# Patient Record
Sex: Female | Born: 1955 | Race: Black or African American | Hispanic: No | Marital: Married | Smoking: Never smoker
Health system: Southern US, Community
[De-identification: ages and names within clinical notes are randomized; demographics above are authoritative.]

## PROBLEM LIST (undated history)

## (undated) DIAGNOSIS — Z5189 Encounter for other specified aftercare: Secondary | ICD-10-CM

## (undated) DIAGNOSIS — F329 Major depressive disorder, single episode, unspecified: Secondary | ICD-10-CM

## (undated) DIAGNOSIS — T7840XA Allergy, unspecified, initial encounter: Secondary | ICD-10-CM

## (undated) DIAGNOSIS — E119 Type 2 diabetes mellitus without complications: Secondary | ICD-10-CM

## (undated) DIAGNOSIS — K635 Polyp of colon: Secondary | ICD-10-CM

## (undated) DIAGNOSIS — G4730 Sleep apnea, unspecified: Secondary | ICD-10-CM

## (undated) DIAGNOSIS — R011 Cardiac murmur, unspecified: Secondary | ICD-10-CM

## (undated) DIAGNOSIS — Z992 Dependence on renal dialysis: Secondary | ICD-10-CM

## (undated) DIAGNOSIS — N189 Chronic kidney disease, unspecified: Secondary | ICD-10-CM

## (undated) DIAGNOSIS — D649 Anemia, unspecified: Secondary | ICD-10-CM

## (undated) DIAGNOSIS — I10 Essential (primary) hypertension: Secondary | ICD-10-CM

## (undated) DIAGNOSIS — F32A Depression, unspecified: Secondary | ICD-10-CM

## (undated) DIAGNOSIS — F419 Anxiety disorder, unspecified: Secondary | ICD-10-CM

## (undated) DIAGNOSIS — E785 Hyperlipidemia, unspecified: Secondary | ICD-10-CM

## (undated) HISTORY — PX: TUBAL LIGATION: SHX77

## (undated) HISTORY — DX: Encounter for other specified aftercare: Z51.89

## (undated) HISTORY — DX: Anxiety disorder, unspecified: F41.9

## (undated) HISTORY — PX: COLONOSCOPY: SHX174

## (undated) HISTORY — DX: Cardiac murmur, unspecified: R01.1

## (undated) HISTORY — DX: Depression, unspecified: F32.A

## (undated) HISTORY — DX: Anemia, unspecified: D64.9

## (undated) HISTORY — DX: Essential (primary) hypertension: I10

## (undated) HISTORY — DX: Allergy, unspecified, initial encounter: T78.40XA

## (undated) HISTORY — DX: Hyperlipidemia, unspecified: E78.5

## (undated) HISTORY — DX: Type 2 diabetes mellitus without complications: E11.9

## (undated) HISTORY — DX: Polyp of colon: K63.5

## (undated) HISTORY — PX: DIALYSIS FISTULA CREATION: SHX611

## (undated) HISTORY — DX: Major depressive disorder, single episode, unspecified: F32.9

## (undated) HISTORY — DX: Sleep apnea, unspecified: G47.30

## (undated) HISTORY — DX: Chronic kidney disease, unspecified: N18.9

## (undated) HISTORY — DX: Dependence on renal dialysis: Z99.2

---

## 2006-06-11 ENCOUNTER — Ambulatory Visit: Payer: Self-pay | Admitting: Family Medicine

## 2006-06-12 ENCOUNTER — Ambulatory Visit (HOSPITAL_COMMUNITY): Admission: RE | Admit: 2006-06-12 | Discharge: 2006-06-12 | Payer: Self-pay | Admitting: Family Medicine

## 2006-06-18 ENCOUNTER — Ambulatory Visit (HOSPITAL_COMMUNITY): Payer: Self-pay | Admitting: Psychiatry

## 2006-06-19 ENCOUNTER — Ambulatory Visit (HOSPITAL_COMMUNITY): Admission: RE | Admit: 2006-06-19 | Discharge: 2006-06-19 | Payer: Self-pay | Admitting: Family Medicine

## 2006-06-26 ENCOUNTER — Ambulatory Visit (HOSPITAL_COMMUNITY): Payer: Self-pay | Admitting: Psychiatry

## 2006-07-03 ENCOUNTER — Ambulatory Visit (HOSPITAL_COMMUNITY): Payer: Self-pay | Admitting: Psychiatry

## 2006-07-30 ENCOUNTER — Ambulatory Visit: Payer: Self-pay | Admitting: Family Medicine

## 2006-10-02 ENCOUNTER — Ambulatory Visit: Payer: Self-pay | Admitting: Family Medicine

## 2006-12-26 ENCOUNTER — Encounter: Admission: RE | Admit: 2006-12-26 | Discharge: 2006-12-26 | Payer: Self-pay | Admitting: Family Medicine

## 2006-12-27 ENCOUNTER — Encounter: Payer: Self-pay | Admitting: Family Medicine

## 2006-12-27 ENCOUNTER — Ambulatory Visit (HOSPITAL_COMMUNITY): Payer: Self-pay | Admitting: Psychiatry

## 2006-12-27 LAB — CONVERTED CEMR LAB
ALT: 23 units/L (ref 0–35)
AST: 21 units/L (ref 0–37)
Albumin: 3.7 g/dL (ref 3.5–5.2)
Alkaline Phosphatase: 66 units/L (ref 39–117)
BUN: 17 mg/dL (ref 6–23)
Bilirubin, Direct: 0.1 mg/dL (ref 0.0–0.3)
CO2: 22 meq/L (ref 19–32)
Calcium: 9.1 mg/dL (ref 8.4–10.5)
Chloride: 107 meq/L (ref 96–112)
Cholesterol: 213 mg/dL — ABNORMAL HIGH (ref 0–200)
Creatinine, Ser: 1.5 mg/dL — ABNORMAL HIGH (ref 0.40–1.20)
Glucose, Bld: 86 mg/dL (ref 70–99)
HDL: 47 mg/dL (ref >39)
Indirect Bilirubin: 0.4 mg/dL (ref 0.0–0.9)
LDL Cholesterol: 121 mg/dL — ABNORMAL HIGH (ref 0–99)
Potassium: 5.1 meq/L (ref 3.5–5.3)
Sodium: 139 meq/L (ref 135–145)
Total Bilirubin: 0.5 mg/dL (ref 0.3–1.2)
Total CHOL/HDL Ratio: 4.5
Total Protein: 6.6 g/dL (ref 6.0–8.3)
Triglycerides: 227 mg/dL — ABNORMAL HIGH (ref <150)
VLDL: 45 mg/dL — ABNORMAL HIGH (ref 0–40)

## 2006-12-31 ENCOUNTER — Other Ambulatory Visit: Admission: RE | Admit: 2006-12-31 | Discharge: 2006-12-31 | Payer: Self-pay | Admitting: Family Medicine

## 2006-12-31 ENCOUNTER — Ambulatory Visit: Payer: Self-pay | Admitting: Family Medicine

## 2006-12-31 ENCOUNTER — Encounter: Payer: Self-pay | Admitting: Family Medicine

## 2006-12-31 LAB — CONVERTED CEMR LAB: Pap Smear: NORMAL

## 2007-01-01 ENCOUNTER — Encounter: Payer: Self-pay | Admitting: Family Medicine

## 2007-01-01 LAB — CONVERTED CEMR LAB
Candida species: NEGATIVE
Chlamydia, DNA Probe: NEGATIVE
GC Probe Amp, Genital: NEGATIVE
Gardnerella vaginalis: NEGATIVE
Trichomonal Vaginitis: NEGATIVE

## 2007-02-20 ENCOUNTER — Ambulatory Visit (HOSPITAL_COMMUNITY): Admission: RE | Admit: 2007-02-20 | Discharge: 2007-02-20 | Payer: Self-pay | Admitting: Family Medicine

## 2007-03-12 ENCOUNTER — Ambulatory Visit (HOSPITAL_COMMUNITY): Payer: Self-pay | Admitting: Psychiatry

## 2007-03-19 ENCOUNTER — Ambulatory Visit (HOSPITAL_COMMUNITY): Payer: Self-pay | Admitting: Psychiatry

## 2007-04-02 ENCOUNTER — Ambulatory Visit (HOSPITAL_COMMUNITY): Payer: Self-pay | Admitting: Psychiatry

## 2007-05-05 ENCOUNTER — Ambulatory Visit (HOSPITAL_COMMUNITY): Payer: Self-pay | Admitting: Psychiatry

## 2007-07-17 ENCOUNTER — Ambulatory Visit: Payer: Self-pay | Admitting: Family Medicine

## 2007-07-30 ENCOUNTER — Ambulatory Visit: Payer: Self-pay | Admitting: Family Medicine

## 2007-08-07 ENCOUNTER — Ambulatory Visit: Payer: Self-pay | Admitting: Family Medicine

## 2007-09-04 ENCOUNTER — Ambulatory Visit: Payer: Self-pay | Admitting: Family Medicine

## 2007-11-27 ENCOUNTER — Encounter: Payer: Self-pay | Admitting: Family Medicine

## 2008-02-02 ENCOUNTER — Ambulatory Visit: Payer: Self-pay | Admitting: Family Medicine

## 2008-04-28 ENCOUNTER — Encounter: Payer: Self-pay | Admitting: Family Medicine

## 2008-04-28 DIAGNOSIS — F418 Other specified anxiety disorders: Secondary | ICD-10-CM | POA: Insufficient documentation

## 2008-04-28 DIAGNOSIS — O0289 Other abnormal products of conception: Secondary | ICD-10-CM | POA: Insufficient documentation

## 2008-04-28 DIAGNOSIS — E785 Hyperlipidemia, unspecified: Secondary | ICD-10-CM | POA: Insufficient documentation

## 2008-04-28 DIAGNOSIS — I10 Essential (primary) hypertension: Secondary | ICD-10-CM | POA: Insufficient documentation

## 2008-04-28 DIAGNOSIS — N289 Disorder of kidney and ureter, unspecified: Secondary | ICD-10-CM | POA: Insufficient documentation

## 2008-07-19 ENCOUNTER — Telehealth: Payer: Self-pay | Admitting: Family Medicine

## 2008-08-23 ENCOUNTER — Ambulatory Visit: Payer: Self-pay | Admitting: Family Medicine

## 2008-08-31 ENCOUNTER — Ambulatory Visit: Payer: Self-pay | Admitting: Family Medicine

## 2008-09-20 ENCOUNTER — Telehealth: Payer: Self-pay | Admitting: Family Medicine

## 2008-09-22 ENCOUNTER — Telehealth: Payer: Self-pay | Admitting: Family Medicine

## 2008-09-23 ENCOUNTER — Ambulatory Visit: Payer: Self-pay | Admitting: Family Medicine

## 2008-09-30 ENCOUNTER — Encounter: Payer: Self-pay | Admitting: Family Medicine

## 2008-10-01 LAB — CONVERTED CEMR LAB
ALT: 18 units/L (ref 0–35)
AST: 22 units/L (ref 0–37)
Albumin: 3.7 g/dL (ref 3.5–5.2)
Alkaline Phosphatase: 47 units/L (ref 39–117)
BUN: 21 mg/dL (ref 6–23)
Basophils Absolute: 0 10*3/uL (ref 0.0–0.1)
Basophils Relative: 0 % (ref 0–1)
Bilirubin, Direct: 0.1 mg/dL (ref 0.0–0.3)
CO2: 20 meq/L (ref 19–32)
Calcium: 9.4 mg/dL (ref 8.4–10.5)
Chloride: 109 meq/L (ref 96–112)
Cholesterol: 265 mg/dL — ABNORMAL HIGH (ref 0–200)
Creatinine, Ser: 2.47 mg/dL — ABNORMAL HIGH (ref 0.40–1.20)
Eosinophils Absolute: 0.2 10*3/uL (ref 0.0–0.7)
Eosinophils Relative: 2 % (ref 0–5)
Glucose, Bld: 92 mg/dL (ref 70–99)
HCT: 39.8 % (ref 36.0–46.0)
HDL: 52 mg/dL (ref >39)
Hemoglobin: 12 g/dL (ref 12.0–15.0)
Indirect Bilirubin: 0.3 mg/dL (ref 0.0–0.9)
LDL Cholesterol: 150 mg/dL — ABNORMAL HIGH (ref 0–99)
Lymphocytes Relative: 34 % (ref 12–46)
Lymphs Abs: 2.4 10*3/uL (ref 0.7–4.0)
MCHC: 30.2 g/dL (ref 30.0–36.0)
MCV: 85.8 fL (ref 78.0–100.0)
Monocytes Absolute: 0.4 10*3/uL (ref 0.1–1.0)
Monocytes Relative: 6 % (ref 3–12)
Neutro Abs: 4 10*3/uL (ref 1.7–7.7)
Neutrophils Relative %: 57 % (ref 43–77)
Platelets: 231 10*3/uL (ref 150–400)
Potassium: 5 meq/L (ref 3.5–5.3)
RBC: 4.64 M/uL (ref 3.87–5.11)
RDW: 15.1 % (ref 11.5–15.5)
Sodium: 140 meq/L (ref 135–145)
TSH: 0.946 microintl units/mL (ref 0.350–4.50)
Total Bilirubin: 0.4 mg/dL (ref 0.3–1.2)
Total CHOL/HDL Ratio: 5.1
Total Protein: 7.2 g/dL (ref 6.0–8.3)
Triglycerides: 313 mg/dL — ABNORMAL HIGH (ref <150)
VLDL: 63 mg/dL — ABNORMAL HIGH (ref 0–40)
WBC: 6.9 10*3/uL (ref 4.0–10.5)

## 2008-10-28 ENCOUNTER — Ambulatory Visit: Payer: Self-pay | Admitting: Family Medicine

## 2009-03-03 ENCOUNTER — Encounter: Payer: Self-pay | Admitting: Family Medicine

## 2009-04-18 ENCOUNTER — Telehealth: Payer: Self-pay | Admitting: Family Medicine

## 2009-04-28 ENCOUNTER — Ambulatory Visit: Payer: Self-pay | Admitting: Anesthesiology

## 2009-04-29 ENCOUNTER — Encounter: Payer: Self-pay | Admitting: Family Medicine

## 2009-04-29 ENCOUNTER — Telehealth: Payer: Self-pay | Admitting: Family Medicine

## 2009-05-16 ENCOUNTER — Encounter: Payer: Self-pay | Admitting: Family Medicine

## 2009-07-04 ENCOUNTER — Ambulatory Visit (HOSPITAL_COMMUNITY): Admission: RE | Admit: 2009-07-04 | Discharge: 2009-07-04 | Payer: Self-pay | Admitting: Family Medicine

## 2009-07-20 ENCOUNTER — Telehealth: Payer: Self-pay | Admitting: Family Medicine

## 2009-09-08 ENCOUNTER — Ambulatory Visit: Payer: Self-pay | Admitting: Family Medicine

## 2009-11-14 ENCOUNTER — Ambulatory Visit: Payer: Self-pay | Admitting: Family Medicine

## 2009-11-14 DIAGNOSIS — J209 Acute bronchitis, unspecified: Secondary | ICD-10-CM | POA: Insufficient documentation

## 2009-11-17 ENCOUNTER — Telehealth: Payer: Self-pay | Admitting: Family Medicine

## 2009-11-17 DIAGNOSIS — J309 Allergic rhinitis, unspecified: Secondary | ICD-10-CM | POA: Insufficient documentation

## 2009-12-13 ENCOUNTER — Encounter: Payer: Self-pay | Admitting: Family Medicine

## 2009-12-14 ENCOUNTER — Telehealth: Payer: Self-pay | Admitting: Family Medicine

## 2009-12-22 ENCOUNTER — Telehealth: Payer: Self-pay | Admitting: Family Medicine

## 2010-02-28 ENCOUNTER — Encounter: Payer: Self-pay | Admitting: Family Medicine

## 2010-03-20 ENCOUNTER — Ambulatory Visit: Payer: Self-pay | Admitting: Family Medicine

## 2010-03-20 DIAGNOSIS — R5383 Other fatigue: Secondary | ICD-10-CM

## 2010-03-20 DIAGNOSIS — R5381 Other malaise: Secondary | ICD-10-CM | POA: Insufficient documentation

## 2010-03-21 ENCOUNTER — Telehealth: Payer: Self-pay | Admitting: Family Medicine

## 2010-03-23 ENCOUNTER — Telehealth: Payer: Self-pay | Admitting: Family Medicine

## 2010-03-23 ENCOUNTER — Encounter: Payer: Self-pay | Admitting: Family Medicine

## 2010-03-31 ENCOUNTER — Encounter: Payer: Self-pay | Admitting: Family Medicine

## 2010-03-31 ENCOUNTER — Telehealth: Payer: Self-pay | Admitting: Family Medicine

## 2010-08-30 ENCOUNTER — Ambulatory Visit: Payer: Self-pay | Admitting: Family Medicine

## 2010-10-09 ENCOUNTER — Ambulatory Visit: Payer: Self-pay | Admitting: Family Medicine

## 2010-10-09 DIAGNOSIS — R0609 Other forms of dyspnea: Secondary | ICD-10-CM | POA: Insufficient documentation

## 2010-10-09 DIAGNOSIS — R079 Chest pain, unspecified: Secondary | ICD-10-CM | POA: Insufficient documentation

## 2010-10-09 DIAGNOSIS — R0989 Other specified symptoms and signs involving the circulatory and respiratory systems: Secondary | ICD-10-CM

## 2010-12-17 ENCOUNTER — Encounter: Payer: Self-pay | Admitting: Family Medicine

## 2010-12-26 NOTE — Letter (Signed)
Summary: Historic phone notes  Historic phone notes   Imported By: Lind Guest 04/06/2010 14:11:54  _____________________________________________________________________  External Attachment:    Type:   Image     Comment:   External Document

## 2010-12-26 NOTE — Assessment & Plan Note (Signed)
Summary: ov   Vital Signs:  Patient profile:   55 year old female Menstrual status:  postmenopausal Height:      64 inches Weight:      165.50 pounds BMI:     28.51 O2 Sat:      97 % on Room air Pulse rate:   78 / minute Pulse rhythm:   regular Resp:     16 per minute BP sitting:   120 / 70  (right arm)  Vitals Entered By: Mauricia Area CMA (October 09, 2010 4:38 PM)  Nutrition Counseling: Patient's BMI is greater than 25 and therefore counseled on weight management options.  O2 Flow:  Room air CC: Hissing noise in ears when she wakes up, slight blood in sputum, leg cramps and crackling in chest at night   Primary Care Provider:  Syliva Overman MD  CC:  Hissing noise in ears when she wakes up, slight blood in sputum, and leg cramps and crackling in chest at night.  History of Present Illness: Pt in with amain concern re chest discomfort and exertional fatigue in the past several weeks. she denies fever or chills reports intermittent cough sometimes with brownn sputum, esp in the early morning. she reports some sinus fillness with hissing sound in the eaars at times.  Current Medications (verified): 1)  Pravastatin Sodium 40 Mg Tabs (Pravastatin Sodium) .... Take 1 Tab By Mouth At Bedtime 2)  Claritin 10 Mg Tabs (Loratadine) .... Take 1 Tablet By Mouth Once A Day 3)  Ergocalciferol 50000 Unit Caps (Ergocalciferol) .... One Tab Every Week 4)  Sodium Bicarbonate 648 Mg Tabs (Sodium Bicarbonate) .... Take 1 Tablet By Mouth Two Times A Day 5)  Amlodipine Besylate 10 Mg Tabs (Amlodipine Besylate) .... One Half Tab Once Daily 6)  Omeprazole 20 Mg Cpdr (Omeprazole) .... Take 1 Tablet By Mouth Once A Day As Needed 7)  Metoprolol Succinate 100 Mg Xr24h-Tab (Metoprolol Succinate) .... One Half Tab Once Daily 8)  Calcitriol 0.25 Mcg Caps (Calcitriol) .... Take 1 Tablet By Mouth Once A Day 9)  Renal Multivitamin .Marland Kitchen.. 1 Tab Daily 10)  Fosrenol 500 Mg Chew (Lanthanum Carbonate) ....  Take As Directed 11)  Furosemide 40 Mg Tabs (Furosemide) .Marland Kitchen.. 1 Tab Twice Daily  Allergies (verified): 1)  ! Sulfa 2)  ! Ibuprofen  Past History:  Past Surgical History: tubal ligation 1986 c section 1985 shunt palced in left arm in July 05 2010 for future dialysis  Review of Systems      See HPI General:  Complains of fatigue. Eyes:  Denies eye pain and red eye. ENT:  Complains of postnasal drainage. CV:  Complains of chest pain or discomfort, fatigue, and shortness of breath with exertion; denies palpitations and swelling of feet; 2 week h/o on climbing the steps at her apt . Resp:  Complains of cough; denies wheezing. GI:  Denies abdominal pain, change in bowel habits, constipation, diarrhea, indigestion, nausea, and vomiting. GU:  Denies dysuria and urinary frequency. MS:  Denies low back pain and mid back pain. Psych:  Complains of anxiety and mental problems; denies depression, easily tearful, and irritability; mild, has been treated i the past by psych. Endo:  Denies cold intolerance, excessive hunger, excessive thirst, and excessive urination. Heme:  Denies abnormal bruising and bleeding. Allergy:  Denies hives or rash and itching eyes.  Physical Exam  General:  Well-developed,well-nourished,in no acute distress; alert,appropriate and cooperative throughout examination HEENT: No facial asymmetry,  EOMI, No sinus tenderness, TM's  Clear, oropharynx  pink and moist.   Chest: Clear to auscultation bilaterally.  CVS: S1, S2, No murmurs, No S3.   Abd: Soft, Nontender.  MS: Adequate ROM spine, hips, shoulders and knees.  Ext: No edema.   CNS: CN 2-12 intact, power tone and sensation normal throughout.   Skin: Intact, no visible lesions or rashes.  Psych: Good eye contact, normal affect.  Memory intact, not anxious or depressed appearing.    Impression & Recommendations:  Problem # 1:  SPECIAL SCREENING FOR MALIGNANT NEOPLASMS COLON (ICD-V76.51) Assessment Comment  Only  Orders: Gastroenterology Referral (GI)  Problem # 2:  CHEST PAIN UNSPECIFIED (ICD-786.50) Assessment: Comment Only  Orders: EKG w/ Interpretation (93000)nSR, no ischemia, pt reassured and willfurther address this at upcoming app[facility] the [facility] later this week  Problem # 3:  RENAL DISEASE (ICD-593.9) Assessment: Deteriorated has fistula placed  Problem # 4:  HYPERTENSION (ICD-401.9) Assessment: Unchanged  Her updated medication list for this problem includes:    Amlodipine Besylate 10 Mg Tabs (Amlodipine besylate) ..... One half tab once daily    Metoprolol Succinate 100 Mg Xr24h-tab (Metoprolol succinate) ..... One half tab once daily    Furosemide 40 Mg Tabs (Furosemide) .Marland Kitchen... 1 tab twice daily  BP today: 120/70 Prior BP: 112/76 (03/20/2010)  Labs Reviewed: K+: 5.0 (09/30/2008) Creat: : 2.47 (09/30/2008)   Chol: 265 (09/30/2008)   HDL: 52 (09/30/2008)   LDL: 150 (09/30/2008)   TG: 313 (09/30/2008)  Problem # 5:  HYPERLIPIDEMIA (ICD-272.4) Assessment: Comment Only  Her updated medication list for this problem includes:    Pravastatin Sodium 40 Mg Tabs (Pravastatin sodium) .Marland Kitchen... Take 1 tab by mouth at bedtime  Labs Reviewed: SGOT: 22 (09/30/2008)   SGPT: 18 (09/30/2008)   HDL:52 (09/30/2008), 47 (12/27/2006)  LDL:150 (09/30/2008), 121 (12/27/2006)  Chol:265 (09/30/2008), 213 (12/27/2006)  Trig:313 (09/30/2008), 227 (12/27/2006) [facility]  Problem # 6:  HYPERLIPIDEMIA (ICD-272.4)  Her updated medication list for this problem includes:    Pravastatin Sodium 40 Mg Tabs (Pravastatin sodium) .Marland Kitchen... Take 1 tab by mouth at bedtime  Complete Medication List: 1)  Pravastatin Sodium 40 Mg Tabs (Pravastatin sodium) .... Take 1 tab by mouth at bedtime 2)  Claritin 10 Mg Tabs (Loratadine) .... Take 1 tablet by mouth once a day 3)  Ergocalciferol 50000 Unit Caps (Ergocalciferol) .... One tab every week 4)  Sodium Bicarbonate 648 Mg Tabs (Sodium bicarbonate) .... Take 1  tablet by mouth two times a day 5)  Amlodipine Besylate 10 Mg Tabs (Amlodipine besylate) .... One half tab once daily 6)  Omeprazole 20 Mg Cpdr (Omeprazole) .... Take 1 tablet by mouth once a day as needed 7)  Metoprolol Succinate 100 Mg Xr24h-tab (Metoprolol succinate) .... One half tab once daily 8)  Calcitriol 0.25 Mcg Caps (Calcitriol) .... Take 1 tablet by mouth once a day 9)  Renal Multivitamin  .Marland Kitchen.. 1 tab daily 10)  Fosrenol 500 Mg Chew (Lanthanum carbonate) .... Take as directed 11)  Furosemide 40 Mg Tabs (Furosemide) .Marland Kitchen.. 1 tab twice daily  Patient Instructions: 1)  Please schedule a follow-up appointment in 4 to 6 months . 2)  you will be referred for colonscopy. 3)  eKG today for chest pain   Orders Added: 1)  Est. Patient Level IV [81191] 2)  EKG w/ Interpretation [93000] 3)  Gastroenterology Referral [GI]

## 2010-12-26 NOTE — Progress Notes (Signed)
Summary: high blood pressure  Phone Note Call from Patient   Summary of Call: blood pressure high and chills and sweat. [phone] Initial call taken by: Rudene Anda,  September 20, 2008 10:16 AM  Follow-up for Phone Call        RETURNED CALL, LEFT MESSAGE Follow-up by: Worthy Keeler LPN,  September 20, 2008 3:02 PM  Additional Follow-up for Phone Call Additional follow up Details #1::        returned call, left message Additional Follow-up by: Worthy Keeler LPN,  September 21, 2008 1:55 PM

## 2010-12-26 NOTE — Letter (Signed)
Summary: Historic consults  Historic consults   Imported By: Lind Guest 04/06/2010 14:11:23  _____________________________________________________________________  External Attachment:    Type:   Image     Comment:   External Document

## 2010-12-26 NOTE — Progress Notes (Signed)
Summary: nurse  Phone Note Call from Patient   Summary of Call: needs fior nurse to call her back about bp reading from last time she was in. [phone] Initial call taken by: Rudene Anda,  December 22, 2009 11:33 AM  Follow-up for Phone Call        returned call, left message Follow-up by: Worthy Keeler LPN,  December 22, 2009 3:24 PM  Additional Follow-up for Phone Call Additional follow up Details #1::        returned call, left message Additional Follow-up by: Worthy Keeler LPN,  December 23, 2009 9:36 AM    Additional Follow-up for Phone Call Additional follow up Details #2::    returned call, left message Follow-up by: Worthy Keeler LPN,  December 27, 2009 10:06 AM

## 2010-12-26 NOTE — Letter (Signed)
Summary: TRANSFERED RECORDS  TRANSFERED RECORDS   Imported By: Lind Guest 04/18/2010 17:06:58  _____________________________________________________________________  External Attachment:    Type:   Image     Comment:   External Document

## 2010-12-26 NOTE — Assessment & Plan Note (Signed)
Summary: office visit   Vital Signs:  Patient profile:   55 year old female Menstrual status:  postmenopausal Height:      64 inches Weight:      168.25 pounds BMI:     28.98 O2 Sat:      97 % Pulse rate:   66 / minute Pulse rhythm:   regular Resp:     16 per minute BP sitting:   112 / 76  (left arm) Cuff size:   regular  Vitals Entered By: Everitt Amber LPN (March 20, 2010 3:47 PM)  Nutrition Counseling: Patient's BMI is greater than 25 and therefore counseled on weight management options. CC: Follow up chronic problems   CC:  Follow up chronic problems.  History of Present Illness: Pt states that about 3 weeks ago ao she was told she my be a dialysis candidate  and that she needs to get a fistula . She is concerned and wants another opinion. This was anephrologist who she [facility], sh wants her labs reviewed by me before she goes any further. She has noted increase in her allergy sympptoms and requests med for relief. She goes to once monthly pschological counselling which is helping alos, she no longer takes med for depression or anxiety.  Current Medications (verified): 1)  Hemocyte-Plus   Tabs (B Complex-C-Min-Fe-Fa) .... Take 1 Tablet By Mouth Once A Day 2)  Pravastatin Sodium 40 Mg Tabs (Pravastatin Sodium) .... Take 1 Tab By Mouth At Bedtime 3)  Veetids 500 Mg Tabs (Penicillin V Potassium) .... Take 1 Tablet By Mouth Three Times A Day 4)  Claritin 10 Mg Tabs (Loratadine) .... Take 1 Tablet By Mouth Once A Day 5)  Ergocalciferol 50000 Unit Caps (Ergocalciferol) .... One Tab Every Week 6)  Sodium Bicarbonate 648 Mg Tabs (Sodium Bicarbonate) .... Take 1 Tablet By Mouth Two Times A Day 7)  Amlodipine Besylate 10 Mg Tabs (Amlodipine Besylate) .... One Half Tab Once Daily 8)  Omeprazole 20 Mg Cpdr (Omeprazole) .... Take 1 Tablet By Mouth Once A Day As Needed 9)  Metoprolol Succinate 100 Mg Xr24h-Tab (Metoprolol Succinate) .... One Half Tab Once Daily 10)   Calcitriol 0.25 Mcg Caps (Calcitriol) .... Take 1 Tablet By Mouth Once A Day  Allergies (verified): 1)  ! Sulfa 2)  ! Ibuprofen  Review of Systems General:  Complains of fatigue; denies chills and fever. Eyes:  Denies blurring and discharge. ENT:  Complains of postnasal drainage; denies hoarseness, nasal congestion, sinus pressure, and sore throat. CV:  Denies chest pain or discomfort, difficulty breathing while lying down, palpitations, and swelling of feet. Resp:  Denies cough and sputum productive. GI:  Denies abdominal pain, constipation, diarrhea, nausea, and vomiting. GU:  Denies dysuria and urinary frequency. MS:  Denies joint pain. Derm:  Denies itching and rash. Neuro:  Denies headaches, seizures, and sensation of room spinning. Psych:  Denies anxiety and depression. Endo:  Denies cold intolerance, excessive hunger, excessive thirst, excessive urination, heat intolerance, polyuria, and weight change. Heme:  Denies abnormal bruising and bleeding. Allergy:  Complains of persistent infections.  Physical Exam  General:  Well-developed,well-nourished,in no acute distress; alert,appropriate and cooperative throughout examination HEENT: No facial asymmetry,  EOMI, No sinus tenderness, TM's Clear, oropharynx  pink and moist.   Chest: Clear to auscultation bilaterally.  CVS: S1, S2, No murmurs, No S3.   Abd: Soft, Nontender.  MS: Adequate ROM spine, hips, shoulders and knees.  Ext: No edema.  CNS: CN 2-12 intact, power tone and sensation normal throughout.   Skin: Intact, no visible lesions or rashes.  Psych: Good eye contact, normal affect.  Memory intact, not anxious or depressed appearing.    Impression & Recommendations:  Problem # 1:  ALLERGIC RHINITIS CAUSE UNSPECIFIED (ICD-477.9) Assessment Deteriorated  The following medications were removed from the medication list:    Promethazine Hcl 12.5 Mg Tabs (Promethazine hcl) ..... One tab by mouth three times a day  prn Her updated medication list for this problem includes:    Claritin 10 Mg Tabs (Loratadine) .Marland Kitchen... Take 1 tablet by mouth once a day  Problem # 2:  DEPRESSION (ICD-311) Assessment: Improved  The following medications were removed from the medication list:    Venlafaxine Hcl 150 Mg Tb24 (Venlafaxine hcl) .Marland Kitchen... Take 1 tablet by mouth once a day  Problem # 3:  HYPERTENSION (ICD-401.9) Assessment: Unchanged  The following medications were removed from the medication list:    Enalapril Maleate 20 Mg Tabs (Enalapril maleate) .Marland Kitchen... Take one and one half tablet by mouth once daily Her updated medication list for this problem includes:    Amlodipine Besylate 10 Mg Tabs (Amlodipine besylate) ..... One half tab once daily    Metoprolol Succinate 100 Mg Xr24h-tab (Metoprolol succinate) ..... One half tab once daily  Orders: T-Basic Metabolic Panel [id number])  BP today: 112/76 Prior BP: 124/82 (11/14/2009)  Labs Reviewed: K+: 5.0 (09/30/2008) Creat: : 2.47 (09/30/2008)   Chol: 265 (09/30/2008)   HDL: 52 (09/30/2008)   LDL: 150 (09/30/2008)   TG: 313 (09/30/2008)  Problem # 4:  HYPERLIPIDEMIA (ICD-272.4) Assessment: Comment Only  The following medications were removed from the medication list:    Simvastatin 80 Mg Tabs (Simvastatin) .Marland Kitchen... Take 1 tab by mouth at bedtime Her updated medication list for this problem includes:    Pravastatin Sodium 40 Mg Tabs (Pravastatin sodium) .Marland Kitchen... Take 1 tab by mouth at bedtime  Orders: T-Hepatic Function [id number]) T-Lipid Profile [id number]) will attempt to obtrain most recent labs from the [facility] Reviewed: SGOT: 22 (09/30/2008)   SGPT: 18 (09/30/2008)   HDL:52 (09/30/2008), 47 (12/27/2006)  LDL:150 (09/30/2008), 121 (12/27/2006)  Chol:265 (09/30/2008), 213 (12/27/2006)  Trig:313 (09/30/2008), 227 (12/27/2006)  Problem # 5:  RENAL DISEASE (ICD-593.9) Assessment: Deteriorated expects to need dialysis in the future  Complete Medication  List: 1)  Hemocyte-plus Tabs (B complex-c-min-fe-fa) .... Take 1 tablet by mouth once a day 2)  Pravastatin Sodium 40 Mg Tabs (Pravastatin sodium) .... Take 1 tab by mouth at bedtime 3)  Veetids 500 Mg Tabs (Penicillin v potassium) .... Take 1 tablet by mouth three times a day 4)  Claritin 10 Mg Tabs (Loratadine) .... Take 1 tablet by mouth once a day 5)  Ergocalciferol 50000 Unit Caps (Ergocalciferol) .... One tab every week 6)  Sodium Bicarbonate 648 Mg Tabs (Sodium bicarbonate) .... Take 1 tablet by mouth two times a day 7)  Amlodipine Besylate 10 Mg Tabs (Amlodipine besylate) .... One half tab once daily 8)  Omeprazole 20 Mg Cpdr (Omeprazole) .... Take 1 tablet by mouth once a day as needed 9)  Metoprolol Succinate 100 Mg Xr24h-tab (Metoprolol succinate) .... One half tab once daily 10)  Calcitriol 0.25 Mcg Caps (Calcitriol) .... Take 1 tablet by mouth once a day  Other Orders: Radiology Referral (Radiology) T-CBC w/Diff [id number]) T-TSH [id number])  Patient Instructions: 1)  Please schedule a follow-up appointment in 3.5 months. 2)  BMP prior to visit, ICD-9 3)  TSH prior  to visit, ICD-9:   labs next week if no response from the [place] 4)  CBC w/ Diff prior to visit, ICD-9: 5)  Lipid Panel prior to visit, ICD-9:  and hepatic panel 6)  pls do get the memogram which we will schedule Prescriptions: PRAVASTATIN SODIUM 40 MG TABS (PRAVASTATIN SODIUM) Take 1 tab by mouth at bedtime  #30 Tablet x 5   Entered by:   Everitt Amber LPN   Authorized by:   Syliva Overman MD   Signed by:   Everitt Amber LPN on 52/84/1324   Method used:   Electronically to        [facility] [address] (retail)       [address]       [place], [place]  [address]       Ph: [phone]       Fax: [phone]   RxID:   [id number]

## 2010-12-26 NOTE — Letter (Signed)
Summary: Historic demo  Historic demo   Imported By: Lind Guest 04/06/2010 13:58:42  _____________________________________________________________________  External Attachment:    Type:   Image     Comment:   External Document

## 2010-12-26 NOTE — Letter (Signed)
Summary: MEDICAL RELEASE  MEDICAL RELEASE   Imported By: Lind Guest 03/23/2010 14:24:10  _____________________________________________________________________  External Attachment:    Type:   Image     Comment:   External Document

## 2010-12-26 NOTE — Letter (Signed)
Summary: HISTORY AND PHYSICAL  HISTORY AND PHYSICAL   Imported By: Lind Guest 04/18/2010 16:28:20  _____________________________________________________________________  External Attachment:    Type:   Image     Comment:   External Document

## 2010-12-26 NOTE — Medication Information (Signed)
[facility]   Imported By: Lind Guest 05/17/2009 08:35:31  _____________________________________________________________________  External Attachment:    Type:   Image     Comment:   External Document

## 2010-12-26 NOTE — Assessment & Plan Note (Signed)
Summary: FLU SHOT  Nurse Visit   Allergies: 1)  ! Sulfa 2)  ! Ibuprofen  Immunizations Administered:  Influenza Vaccine # 1:    Vaccine Type: Fluvax Non-MCR    Site: left deltoid    Mfr: novartis    Dose: 0.5 ml    Route: IM    Given by: Worthy Keeler LPN    Exp. Date: 04/11    Lot #: [id number]    VIS given: 06/19/07 version given September 08, 2009.  Orders Added: 1)  Influenza Vaccine NON MCR [00028]

## 2010-12-26 NOTE — Assessment & Plan Note (Signed)
Summary: FLU SHOT  Nurse Visit    Prior Medications: ENALAPRIL MALEATE 20 MG  TABS (ENALAPRIL MALEATE) Take one and one half tablet by mouth once daily HEMOCYTE-PLUS   TABS (B COMPLEX-C-MIN-FE-FA) Take 1 tablet by mouth once a day SIMVASTATIN 80 MG  TABS (SIMVASTATIN) Take 1 tab by mouth at bedtime VENLAFAXINE HCL 150 MG  TB24 (VENLAFAXINE HCL) Take 1 tablet by mouth once a day Current Allergies: ! SULFA ! IBUPROFEN    Orders Added: 1)  Influenza Vaccine NON MCR [00028]    ]  Influenza Vaccine    Vaccine Type: Fluvax Non-MCR    Site: right deltoid    Mfr: GlaxoSmithKline    Dose: 0.5 ml    Route: IM    Given by: Everitt Amber    Exp. Date: 04/2009    Lot #: [id number]

## 2010-12-26 NOTE — Letter (Signed)
Summary: OFFICE NOTE  OFFICE NOTE   Imported By: Lind Guest 09/01/2008 09:44:03  _____________________________________________________________________  External Attachment:    Type:   Image     Comment:   External Document

## 2010-12-26 NOTE — Letter (Signed)
Summary: CONSULTS  CONSULTS   Imported By: Lind Guest 04/18/2010 16:42:55  _____________________________________________________________________  External Attachment:    Type:   Image     Comment:   External Document

## 2010-12-26 NOTE — Letter (Signed)
Summary: DEMO  DEMO   Imported By: Lind Guest 04/18/2010 16:27:44  _____________________________________________________________________  External Attachment:    Type:   Image     Comment:   External Document

## 2010-12-26 NOTE — Assessment & Plan Note (Signed)
Summary: flu shot  Nurse Visit  Comments Patient in to recieve flu vaccine     Allergies: 1)  ! Sulfa 2)  ! Ibuprofen  Orders Added: 1)  Influenza Vaccine NON MCR [00028]   Influenza Vaccine    Vaccine Type: Fluvax Non-MCR    Site: right deltoid    Mfr: novartis     Dose: 0.5 ml    Route: IM    Given by: Everitt Amber LPN    Exp. Date: 03/2011    Lot #: [id number]

## 2010-12-26 NOTE — Progress Notes (Signed)
Summary: Doc name  Phone Note Call from Patient   Summary of Call: pt states the kidney doc name is Kinnie Scales who is nephrologist. fax number is [phone]  Initial call taken by: Rudene Anda,  March 21, 2010 4:59 PM  Follow-up for Phone Call        noted and faxed medical release Follow-up by: Everitt Amber LPN,  March 22, 2010 9:29 AM

## 2010-12-26 NOTE — Progress Notes (Signed)
  Phone Note Other Incoming   Caller: dr Breeze Angell Summary of Call: pls contac Fey let her know I reviewed her labs , she nees no new labs at thistime, and from the records she absolutely nees to prepare for dialysis, i tried to call twice unsuccessfullt today pls let her know Initial call taken by: Syliva Overman MD,  Mar 31, 2010 12:18 PM  Follow-up for Phone Call        Patient aware Follow-up by: Everitt Amber LPN,  Apr 01, 4539 3:30 PM

## 2010-12-26 NOTE — Letter (Signed)
Summary: LABS  LABS   Imported By: Lind Guest 04/18/2010 16:31:21  _____________________________________________________________________  External Attachment:    Type:   Image     Comment:   External Document

## 2010-12-26 NOTE — Progress Notes (Signed)
Summary: CALL BACK  Phone Note Call from Patient   Summary of Call: CALL HER BACK AND LET HER KNOW IF SHE  NEEDS TO GET HER LAB WORK  DONE Initial call taken by: Lind Guest,  March 23, 2010 3:57 PM  Follow-up for Phone Call        I know you got some of the requested information so does she still need to have the labwork done? Follow-up by: Everitt Amber LPN,  March 23, 2010 4:10 PM  Additional Follow-up for Phone Call Additional follow up Details #1::        can you pls request that they send the labs back I do not see them , sorry Additional Follow-up by: Syliva Overman MD,  Mar 29, 2010 6:56 AM    Additional Follow-up for Phone Call Additional follow up Details #2::    resent for labs and told her to hold off on getting her labs until she heard from [place] Follow-up by: Everitt Amber LPN,  Mar 29, 1609 9:36 AM

## 2010-12-26 NOTE — Letter (Signed)
Summary: Historic labs  Historic labs   Imported By: Lind Guest 04/06/2010 14:08:35  _____________________________________________________________________  External Attachment:    Type:   Image     Comment:   External Document

## 2010-12-26 NOTE — Progress Notes (Signed)
Summary: NEPHROLOGY  NEPHROLOGY   Imported By: Lind Guest 06/08/2010 15:26:38  _____________________________________________________________________  External Attachment:    Type:   Image     Comment:   External Document

## 2010-12-26 NOTE — Letter (Signed)
Summary: PHONE NOTE  PHONE NOTE   Imported By: Lind Guest 04/18/2010 17:06:16  _____________________________________________________________________  External Attachment:    Type:   Image     Comment:   External Document

## 2010-12-26 NOTE — Assessment & Plan Note (Signed)
Summary: OV   Vital Signs:  Patient Profile:   55 Years Old Female Height:     64 inches Weight:      165.25 pounds BMI:     28.47 O2 Sat:      97 % Temp:     99.5 degrees F oral Pulse rate:   91 / minute Pulse rhythm:   regular Resp:     16 per minute BP sitting:   118 / 72  (left arm)  Pt. in pain?   no  Vitals Entered By: Everitt Amber (September 23, 2008 3:35 PM)                  Chief Complaint:  Follow up, diarrhea, nausea, and sweats at night.  History of Present Illness: Pt. reports a 1 week h/o nausea and diarreah. She also reports occasional left sided headaches  in the past 3 weeks. Ms Sargent has resumed her old BP meds which she already had, reports an excellent blood presure with no adverse side effects, she wants to continue these. She denis depression or anxiety, and discontinued her antidepressant stating that was not the answer to her problems and that the S/E of a low sex drive ws worse than her depression.    Current Allergies: ! SULFA ! IBUPROFEN     Review of Systems  ENT      Denies earache, hoarseness, nasal congestion, sinus pressure, and sore throat.  Resp      Denies cough and sputum productive.  GI      See HPI  GU      Denies dysuria and urinary frequency.  MS      Denies joint pain and stiffness.  Psych      Denies anxiety and depression.   Physical Exam  General:     Well-developed,well-nourished,in no acute distress; alert,appropriate and cooperative throughout examination Head:     Normocephalic and atraumatic without obvious abnormalities. No apparent alopecia or balding. Eyes:     vision grossly intact.   Ears:     R ear normal and L ear normal.   Nose:     no nasal discharge.   Mouth:     pharynx pink and moist and fair dentition.   Neck:     No deformities, masses, or tenderness noted. Lungs:     Normal respiratory effort, chest expands symmetrically. Lungs are clear to auscultation, no crackles or wheezes.  Heart:     Normal rate and regular rhythm. S1 and S2 normal without gallop, murmur, click, rub or other extra sounds. Abdomen:     soft and non-tender.   Extremities:     No clubbing, cyanosis, edema, or deformity noted with normal full range of motion of all joints.   Skin:     Intact without suspicious lesions or rashes Cervical Nodes:     No lymphadenopathy noted Psych:     Cognition and judgment appear intact. Alert and cooperative with normal attention span and concentration. No apparent delusions, illusions, hallucinations    Impression & Recommendations:  Problem # 1:  HYPERLIPIDEMIA (ICD-272.4) Assessment: Comment Only  Her updated medication list for this problem includes:    Simvastatin 80 Mg Tabs (Simvastatin) .Marland Kitchen... Take 1 tab by mouth at bedtime  Orders: T-Hepatic Function [id number]) T-Lipid Profile [phone])   Problem # 2:  HYPERTENSION (ICD-401.9) Assessment: Improved  Her updated medication list for this problem includes:    Enalapril Maleate 20 Mg Tabs (Enalapril maleate) .Marland Kitchen... Take  one and one half tablet by mouth once daily  Orders: T-Basic Metabolic Panel [id number])  BP today: 118/72  Labs Reviewed: Creat: 1.50 (12/27/2006) Chol: 213 (12/27/2006)   HDL: 47 (12/27/2006)   LDL: 121 (12/27/2006)   TG: 227 (12/27/2006)   Problem # 3:  GASTROENTERITIS (ICD-558.9) Assessment: Comment Only  Her updated medication list for this problem includes:    Lomotil 2.5-0.025 Mg Tabs (Diphenoxylate-atropine) ..... One tab by mouth qid prn   Problem # 4:  NAUSEA (ICD-787.02) Assessment: Comment Only  Her updated medication list for this problem includes:    Promethazine Hcl 12.5 Mg Tabs (Promethazine hcl) ..... One tab by mouth three times a day prn  Orders: Zofran 1mg . injection ([id number]) Admin of Therapeutic Inj  intramuscular or subcutaneous ([id number])   Complete Medication List: 1)  Enalapril Maleate 20 Mg Tabs (Enalapril maleate) .... Take one  and one half tablet by mouth once daily 2)  Hemocyte-plus Tabs (B complex-c-min-fe-fa) .... Take 1 tablet by mouth once a day 3)  Simvastatin 80 Mg Tabs (Simvastatin) .... Take 1 tab by mouth at bedtime 4)  Venlafaxine Hcl 150 Mg Tb24 (Venlafaxine hcl) .... Take 1 tablet by mouth once a day 5)  Promethazine Hcl 12.5 Mg Tabs (Promethazine hcl) .... One tab by mouth three times a day prn 6)  Lomotil 2.5-0.025 Mg Tabs (Diphenoxylate-atropine) .... One tab by mouth qid prn  Other Orders: T-CBC w/Diff ([id number]) T-TSH ([id number])  Future Orders: Radiology Referral (Radiology) ... 09/24/2008   Patient Instructions: 1)  CPE in february 2010. 2)  BMP prior to visit, ICD-9: 3)  Hepatic Panel prior to visit, ICD-9: 4)  Lipid Panel prior to visit, ICD-9: 5)  TSH prior to visit, ICD-9: 6)  CBC w/ Diff prior to visit, ICD-9:  Dx hypertension, hyperlipidemia and fatigue. 7)  You will get an injection  of Zofran for acute gastroenteritis and prescription is being sent in for phenergan and lomtil.   Prescriptions: LOMOTIL 2.5-0.025 MG TABS (DIPHENOXYLATE-ATROPINE) one tab by mouth qid prn  #30 x 0   Entered by:   Worthy Keeler LPN   Authorized by:   Syliva Overman MD   Signed by:   Worthy Keeler LPN on 28/41/3244   Method used:   Printed then faxed to ...       [facility] (retail)       [address]       [place], [place]  [address]       Ph: [phone]       Fax: [phone]   RxID:   [id number] PROMETHAZINE HCL 12.5 MG TABS (PROMETHAZINE HCL) one tab by mouth three times a day prn  #30 x 0   Entered by:   Worthy Keeler LPN   Authorized by:   Syliva Overman MD   Signed by:   Worthy Keeler LPN on 63/11/6008   Method used:   Printed then faxed to ...       [facility]  [address] (retail)       [address]       [place], [place]  [address]       Ph: [phone]       Fax: [phone]   RxID:   [id number]  ]     Medication Administration  Injection # 1:    Medication: Zofran 1mg . injection    Diagnosis: NAUSEA (ICD-787.02)  Route: IM    Site: LUOQ gluteus    Exp Date: 4/11    Lot #: [id number]    Mfr: [email or web address]    Comments: zofran 4mg  given    Patient tolerated injection without complications    Given by: Worthy Keeler LPN (September 23, 2008 4:30 PM)  Orders Added: 1)  Est. Patient Level IV [99214] 2)  T-Basic Metabolic Panel [phone] 3)  T-Hepatic Function [80076-22960] 4)  T-Lipid Profile [80061-22930] 5)  T-CBC w/Diff [[id number]] 6)  T-TSH [[id number]] 7)  Zofran 1mg . injection [J2405] 8)  Admin of Therapeutic Inj  intramuscular or subcutaneous [96372] 9)  Radiology Referral [Radiology]

## 2010-12-26 NOTE — Letter (Signed)
Summary: Historic misc  Historic misc   Imported By: Lind Guest 04/06/2010 14:30:41  _____________________________________________________________________  External Attachment:    Type:   Image     Comment:   External Document

## 2010-12-26 NOTE — Letter (Signed)
Summary: XRAY   XRAY   Imported By: Lind Guest 04/18/2010 16:32:13  _____________________________________________________________________  External Attachment:    Type:   Image     Comment:   External Document

## 2010-12-26 NOTE — Assessment & Plan Note (Signed)
Summary: OV   Vital Signs:  Patient profile:   55 year old female Menstrual status:  postmenopausal Height:      64 inches Weight:      173.56 pounds BMI:     29.90 O2 Sat:      98 % Pulse rate:   85 / minute Resp:     16 per minute BP sitting:   140 / 90  (left arm) Cuff size:   regular  Vitals Entered By: Everitt Amber (April 28, 2009 4:12 PM)  Nutrition Counseling: Patient's BMI is greater than 25 and therefore counseled on weight management options. CC: Left foot/ankle has been swelling since 3 weeks ago. Goes down alittle bit with elevation. Only in the left foot. At night her body gets warm to the touch  years   days  Menstrual Status postmenopausal Last PAP Result Normal   CC:  Left foot/ankle has been swelling since 3 weeks ago. Goes down alittle bit with elevation. Only in the left foot. At night her body gets warm to the touch.  History of Present Illness: Pt reports deterioration in hermental health over the past 4 months. She is having increased social withdrwal, feeling overwhelmed,crying spells, and reports a breakdown in her social relationships. She feels that the death of her 55 y/o dog was the trigger.she has also stopped her antidepressants, but will resume them. [facility] since she [facility] was [facility], and has again had her BP med changed, unfortuinately her pressure is high She c/o intermittent left ankle swelling for about 2 months, it tends to resolve overnight , and primarily occurs after she is standing for a prolonged time.  Allergies: 1)  ! Sulfa 2)  ! Ibuprofen  Review of Systems      See HPI General:  Denies chills and fever. ENT:  Denies earache, hoarseness, nasal congestion, postnasal drainage, sinus pressure, and sore throat. CV:  Denies chest pain or discomfort, difficulty breathing while lying down, palpitations, and shortness of breath with exertion. Resp:  Denies cough, sputum productive, and wheezing. GI:   Denies abdominal pain, constipation, diarrhea, nausea, and vomiting. GU:  Denies dysuria and urinary frequency. Psych:  Complains of anxiety, depression, and easily tearful; 8 month h/o increased crying , social withdrawal and feeling overwhelmed, ever since her dog died , ready to go back on meds. .  Physical Exam  General:  alert, well-nourished, and well-hydrated. HEENT: No facial asymmetry,  EOMI, No sinus tenderness, TM's Clear, oropharynx  pink and moist.   Chest: Clear to auscultation bilaterally.  CVS: S1, S2, No murmurs, No S3.   Abd: Soft, Nontender.  MS: Adequate ROM spine, hips, shoulders and knees.  Ext: No edema.   CNS: CN 2-12 intact, power tone and sensation normal throughout.   Skin: Intact, no visible lesions or rashes.  Psych: Good eye contact, normal affect.  Memory intact, not anxious or depressed appearing.      Impression & Recommendations:  Problem # 1:  DEPRESSION (ICD-311) Assessment Deteriorated  Her updated medication list for this problem includes:    Venlafaxine Hcl 150 Mg Tb24 (Venlafaxine hcl) .Marland Kitchen... Take 1 tablet by mouth once a day  Problem # 2:  HYPERTENSION (ICD-401.9) Assessment: Deteriorated  Her updated medication list for this problem includes:    Enalapril Maleate 20 Mg Tabs (Enalapril maleate) .Marland Kitchen... Take one and one half tablet by mouth once daily  BP today: 140/90 Prior BP: 102/70 (10/28/2008)  Labs Reviewed: K+: 5.0 (09/30/2008) Creat: : 2.47 (09/30/2008)   Chol: 265 (09/30/2008)   HDL: 52 (09/30/2008)   LDL: 150 (09/30/2008)   TG: 313 (09/30/2008)  Problem # 3:  HYPERLIPIDEMIA (ICD-272.4) Assessment: Comment Only  Her updated medication list for this problem includes:    Simvastatin 80 Mg Tabs (Simvastatin) .Marland Kitchen... Take 1 tab by mouth at bedtime    Pravastatin Sodium 40 Mg Tabs (Pravastatin sodium) .Marland Kitchen... Take 1 tab by mouth at bedtime  Labs Reviewed: SGOT: 22 (09/30/2008)   SGPT: 18 (09/30/2008)   HDL:52 (09/30/2008), 47  (16/08/9603)  LDL:150 (09/30/2008), 121 (12/27/2006)  Chol:265 (09/30/2008), 213 (12/27/2006)  Trig:313 (09/30/2008), 227 (12/27/2006)  Complete Medication List: 1)  Enalapril Maleate 20 Mg Tabs (Enalapril maleate) .... Take one and one half tablet by mouth once daily 2)  Hemocyte-plus Tabs (B complex-c-min-fe-fa) .... Take 1 tablet by mouth once a day 3)  Simvastatin 80 Mg Tabs (Simvastatin) .... Take 1 tab by mouth at bedtime 4)  Venlafaxine Hcl 150 Mg Tb24 (Venlafaxine hcl) .... Take 1 tablet by mouth once a day 5)  Promethazine Hcl 12.5 Mg Tabs (Promethazine hcl) .... One tab by mouth three times a day prn 6)  Lomotil 2.5-0.025 Mg Tabs (Diphenoxylate-atropine) .... One tab by mouth qid prn 7)  Pravastatin Sodium 40 Mg Tabs (Pravastatin sodium) .... Take 1 tab by mouth at bedtime  Other Orders: Radiology Referral (Radiology) TLB-H. Pylori Abs(Helicobacter Pylori) ([id number]) Diagnostic X-Ray/Fluoroscopy (Diagnostic X-Ray/Flu)  Patient Instructions: 1)  CPE  in 5 weeks. 2)  Pls resume your meds today. 3)  BMP prior to visit, ICD-9: 4)  Hepatic Panel prior to visit, ICD-9:  fasting asap 5)  Lipid Panel prior to visit, ICD-9: 6)  TSH prior to visit, ICD-9: 7)  CBC w/ Diff prior to visit, ICD-9: Prescriptions: VENLAFAXINE HCL 150 MG  TB24 (VENLAFAXINE HCL) Take 1 tablet by mouth once a day  #90 x 3   Entered by:   Lind Guest   Authorized by:   Syliva Overman MD   Signed by:   Syliva Overman MD on 05/01/2009   Method used:   Handwritten   RxID:   [id number]

## 2010-12-26 NOTE — Assessment & Plan Note (Signed)
Summary: h1n1  Nurse Visit   Vital Signs:  Patient Profile:   55 Years Old Female CC:      Nurse Visit to get h1n1vaccine Height:     64 inches Weight:      164.50 pounds BMI:     28.34 O2 Sat:      96 % Pulse rate:   82 / minute Resp:     16 per minute BP sitting:   102 / 70  (left arm)  Vitals Entered By: Everitt Amber (October 28, 2008 3:16 PM)                Patient recieved H1N1 vaccine  Prior Medications: ENALAPRIL MALEATE 20 MG  TABS (ENALAPRIL MALEATE) Take one and one half tablet by mouth once daily HEMOCYTE-PLUS   TABS (B COMPLEX-C-MIN-FE-FA) Take 1 tablet by mouth once a day SIMVASTATIN 80 MG  TABS (SIMVASTATIN) Take 1 tab by mouth at bedtime VENLAFAXINE HCL 150 MG  TB24 (VENLAFAXINE HCL) Take 1 tablet by mouth once a day PROMETHAZINE HCL 12.5 MG TABS (PROMETHAZINE HCL) one tab by mouth three times a day prn LOMOTIL 2.5-0.025 MG TABS (DIPHENOXYLATE-ATROPINE) one tab by mouth qid prn PRAVASTATIN SODIUM 40 MG TABS (PRAVASTATIN SODIUM) Take 1 tab by mouth at bedtime Current Allergies: ! SULFA ! IBUPROFEN      ]

## 2010-12-26 NOTE — Letter (Signed)
Summary: medical release  medical release   Imported By: Lind Guest 05/16/2009 14:17:10  _____________________________________________________________________  External Attachment:    Type:   Image     Comment:   External Document

## 2010-12-26 NOTE — Letter (Signed)
Summary: Historic history & physical  Historic history & physical   Imported By: Lind Guest 04/06/2010 14:00:16  _____________________________________________________________________  External Attachment:    Type:   Image     Comment:   External Document

## 2010-12-26 NOTE — Assessment & Plan Note (Signed)
Summary: OV   Vital Signs:  Patient profile:   55 year old female Menstrual status:  postmenopausal Height:      64 inches Weight:      166 pounds BMI:     28.60 O2 Sat:      97 % Temp:     98.9 degrees F Pulse rate:   60 / minute Resp:     16 per minute BP sitting:   124 / 82 Cuff size:   regular  Vitals Entered By: Everitt Amber (November 14, 2009 3:13 PM) CC: fever, chills and body aches, shortness of breath, no appetite   CC:  fever, chills and body aches, shortness of breath, and no appetite.  History of Present Illness: 5 day h/o fever to 103, chills , sore throat, clear runny nose, and cough productive of yellow sputum. Prior to this she has been doing well. She has been  in therapy for depression and reports improvement.  Allergies (verified): 1)  ! Sulfa 2)  ! Ibuprofen  Review of Systems      See HPI ENT:  Complains of hoarseness and sore throat; 5 day history. CV:  Denies chest pain or discomfort, palpitations, and swelling of feet. Resp:  See HPI; Complains of cough and sputum productive. GI:  Denies abdominal pain, diarrhea, nausea, and vomiting. GU:  Denies dysuria and urinary frequency. MS:  Complains of low back pain, mid back pain, muscle aches, and muscle weakness. Derm:  Denies itching and rash. Neuro:  Denies headaches, seizures, and sensation of room spinning. Psych:  See HPI; Complains of anxiety and depression; denies sense of great danger, suicidal thoughts/plans, thoughts of violence, and unusual visions or sounds. Allergy:  Complains of seasonal allergies; increased symptoms.  Physical Exam  General:  Well-developed,well-nourished,in no acute distress; alert,appropriate and cooperative throughout examination HEENT: No facial asymmetry,  EOMI, No sinus tenderness, TM's Clear, oropharynx  pink and moist. Nasal mucosa erythematous and edematous  Chest: decreased air entry, scatterd crackles and wheezes CVS: S1, S2, No murmurs, No S3.   Abd:  Soft, Nontender.  MS: Adequate ROM spine, hips, shoulders and knees.  Ext: No edema.   CNS: CN 2-12 intact, power tone and sensation normal throughout.   Skin: Intact, no visible lesions or rashes.  Psych: Good eye contact, normal affect.  Memory intact, not anxious or depressed appearing.    Impression & Recommendations:  Problem # 1:  ACUTE BRONCHITIS (ICD-466.0) Assessment Comment Only  Her updated medication list for this problem includes:    Veetids 500 Mg Tabs (Penicillin v potassium) .Marland Kitchen... 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 Take one and one half tablet by mouth once daily  Orders: T-Basic Metabolic Panel [phone])  BP today: 124/82 Prior BP: 140/90 (04/28/2009)  Labs Reviewed: K+: 5.0 (09/30/2008) Creat: : 2.47 (09/30/2008)   Chol: 265 (09/30/2008)   HDL: 52 (09/30/2008)   LDL: 150 (09/30/2008)   TG: 313 (09/30/2008)  Problem # 4:  HYPERLIPIDEMIA (ICD-272.4) Assessment: Comment Only  Her updated medication list for this problem includes:    Simvastatin 80 Mg Tabs (Simvastatin) .Marland KitchenMarland KitchenMarland KitchenMarland Kitchen  Take 1 tab by mouth at bedtime    Pravastatin Sodium 40 Mg Tabs (Pravastatin sodium) .Marland Kitchen... Take 1 tab by mouth at bedtime  Orders: T-Hepatic Function [id number]) T-Lipid Profile [phone]) getting most oof health care from the [facility] Reviewed: SGOT: 22 (09/30/2008)   SGPT: 18 (09/30/2008)   HDL:52 (09/30/2008), 47 (12/27/2006)  LDL:150 (09/30/2008), 121 (12/27/2006)  Chol:265 (09/30/2008), 213 (12/27/2006)  Trig:313 (09/30/2008), 227 (12/27/2006)  Problem # 5:  ALLERGIC  RHINITIS CAUSE UNSPECIFIED (ICD-477.9) Assessment: Deteriorated  Her updated medication list for this problem includes:    Promethazine Hcl 12.5 Mg Tabs (Promethazine hcl) ..... One tab by mouth three times a day prn    Claritin 10 Mg Tabs (Loratadine) .Marland Kitchen... Take 1 tablet by mouth once a day  Complete Medication List: 1)  Enalapril Maleate 20 Mg Tabs (Enalapril maleate) .... Take one and one half tablet by mouth once daily 2)  Hemocyte-plus Tabs (B complex-c-min-fe-fa) .... Take 1 tablet by mouth once a day 3)  Simvastatin 80 Mg Tabs (Simvastatin) .... Take 1 tab by mouth at bedtime 4)  Venlafaxine Hcl 150 Mg Tb24 (Venlafaxine hcl) .... Take 1 tablet by mouth once a day 5)  Promethazine Hcl 12.5 Mg Tabs (Promethazine hcl) .... One tab by mouth three times a day prn 6)  Lomotil 2.5-0.025 Mg Tabs (Diphenoxylate-atropine) .... One tab by mouth qid prn 7)  Pravastatin Sodium 40 Mg Tabs (Pravastatin sodium) .... Take 1 tab by mouth at bedtime 8)  Veetids 500 Mg Tabs (Penicillin v potassium) .... Take 1 tablet by mouth three times a day 9)  Claritin 10 Mg Tabs (Loratadine) .... Take 1 tablet by mouth once a day  Other Orders: T-CBC w/Diff ([id number]) T-TSH [id number]) Radiology Referral (Radiology)  Patient Instructions: 1)  Please schedule a follow-up appointment in 4 months. 2)    You are being treateed for acute bronchitis and viral syndrome. 3)  Labs are passed due pls do asap 4)  BMP prior to visit, ICD-9: 5)  Hepatic Panel prior to visit, ICD-9: 6)  Lipid Panel prior to visit, ICD-9:  fasting asap 7)  TSH prior to visit, ICD-9: 8)  CBC w/ Diff prior to visit, ICD-9: 9)  you will be given mucinex,plstake one daily for the next 6 days 10)  Get plenty of rest, drink lots of clear liquids, and use Tylenol or Ibuprofen for fever and comfort. Return in 7-10 days if you're not better:sooner if you're feeling worse. 11)  Schedule your mammogram.We will schedule Prescriptions: CLARITIN  10 MG TABS (LORATADINE) Take 1 tablet by mouth once a day  #30 x 3   Entered and Authorized by:   Syliva Overman MD   Signed by:   Syliva Overman MD on 11/17/2009   Method used:   Printed then faxed to ...       [facility]  [address] ([facility]ail)       [address]       [place], [place]  [address]       Ph: [phone]       Fax: [phone]   RxID:   [id number] VEETIDS 500 MG TABS (PENICILLIN V POTASSIUM) Take 1 tablet by mouth three times a day  #30 x 0   Entered and Authorized by:   Syliva Overman MD   Signed by:   Syliva Overman MD on 11/14/2009   Method used:   Electronically to  [facility] (retail)       [address]       [place], [place]  [address]       Ph: [phone]       Fax: [phone]   RxID:   [id number]    Medication Administration  Injection # 1:    Medication: Rocephin  250mg     Diagnosis: ACUTE BRONCHITIS (ICD-466.0)    Route: IM    Site: RUOQ gluteus    Exp Date: 4/13    Lot #: [id number]    Mfr: [email or web address]    Comments: rocephin 500mg  given    Patient tolerated injection without complications    Given by: Worthy Keeler LPN (November 14, 2009 4:14 PM)  Orders Added: 1)  Est. Patient Level IV [99214] 2)  T-Basic Metabolic Panel [phone] 3)  T-Hepatic Function [80076-22960] 4)  T-Lipid Profile [80061-22930] 5)  T-CBC w/Diff [74259-56387] 6)  T-TSH [[id number]] 7)  Rocephin  250mg  [J0696] 8)  Admin of Therapeutic Inj  intramuscular or subcutaneous [96372] 9)  Radiology Referral [Radiology]

## 2010-12-26 NOTE — Progress Notes (Signed)
Summary: medicine  Phone Note Call from Patient   Summary of Call: left message to tell her medications call her back Initial call taken by: Lind Guest,  April 29, 2009 2:43 PM  Follow-up for Phone Call        advise her to bring the bottles for the nurse to enter, tell her tomorrow afternoon since i will be out pls Follow-up by: Syliva Overman MD,  May 02, 2009 12:35 PM  Additional Follow-up for Phone Call Additional follow up Details #1::        SHE CALLED BACK AND GAVE THE NAMES OF THEM AMLODIPINE  10 MG 1/2 TAB  1 A DAY FUROSEMIDE  40 MG 2 X A DAY  AND THE EXPERM MAKE HER SICK   NAUSU Additional Follow-up by: Lind Guest,  May 02, 2009 1:18 PM    Additional Follow-up for Phone Call Additional follow up Details #2::    noted Follow-up by: Syliva Overman MD,  May 02, 2009 1:37 PM

## 2010-12-26 NOTE — Progress Notes (Signed)
Summary: NEEDS YOU TO CALL HER VERY IMPORTANT  Phone Note Call from Patient   Summary of Call: NEEDS FOR YOU TO CALL HER AT [phone] HAS THE FAX # [phone] BUT SHE NEEDS FOR YOU TO CALL HER BEFORE ABOUT THE MEDICATIONS Initial call taken by: Lind Guest,  December 14, 2009 1:58 PM  Follow-up for Phone Call        Michaela [name] was calling about her husband Follow-up by: Worthy Keeler LPN,  December 14, 2009 2:28 PM

## 2010-12-26 NOTE — Letter (Signed)
Summary: Michaela [name]   Imported By: Lind Guest 12/13/2009 13:57:02  _____________________________________________________________________  External Attachment:    Type:   Image     Comment:   External Document

## 2010-12-26 NOTE — Progress Notes (Signed)
Summary: Office Visit / [facility]  Office Visit / [facility]   Imported By: Lind Guest 05/17/2009 08:37:41  _____________________________________________________________________  External Attachment:    Type:   Image     Comment:   External Document

## 2010-12-26 NOTE — Progress Notes (Signed)
Summary: nephrology  nephrology   Imported By: Lind Guest 03/31/2010 13:15:18  _____________________________________________________________________  External Attachment:    Type:   Image     Comment:   External Document

## 2010-12-26 NOTE — Letter (Signed)
Summary: Historic office notes  Historic office notes   Imported By: Lind Guest 04/06/2010 14:09:28  _____________________________________________________________________  External Attachment:    Type:   Image     Comment:   External Document

## 2010-12-26 NOTE — Miscellaneous (Signed)
Summary: Orders Update  Clinical Lists Changes  Orders: Added new Test order of T-Basic Metabolic Panel ([id number]) - Signed Added new Test order of T-Hepatic Function ([id number]) - Signed Added new Test order of T-Lipid Profile ([id number]) - Signed Added new Test order of T-CBC w/Diff ([id number]) - Signed Added new Test order of T-TSH ([id number]) - Signed  

## 2010-12-26 NOTE — Progress Notes (Signed)
Summary: results  Phone Note Call from Patient   Summary of Call: needs to speak with nurse. wants some results. [phone] Initial call taken by: Rudene Anda,  July 19, 2008 4:03 PM  Follow-up for Phone Call        PATIENT WANTS TO SPEAK TO DR Lodema [facility]ARD[facility] Follow-up by: Worthy Keeler LPN,  July 19, 2008 5:01 PM    Additional Follow-up for Phone Call Additional follow up Details #2::    Discussed with pt. that she shhould follow the advice of [facility], reports that her potassium was high, she needed kayexelate and was told that she should stop her ACE inhibitor, I advised her to do so and request another antihypertensive agent because of thereported expressed concernr Follow-up by: Syliva Overman MD,  July 22, 2008 5:44 PM

## 2010-12-26 NOTE — Letter (Signed)
Summary: Historic x rays  Historic x rays   Imported By: Lind Guest 04/06/2010 14:25:21  _____________________________________________________________________  External Attachment:    Type:   Image     Comment:   External Document

## 2011-01-25 ENCOUNTER — Telehealth: Payer: Self-pay | Admitting: Family Medicine

## 2011-01-25 ENCOUNTER — Encounter: Payer: Self-pay | Admitting: Family Medicine

## 2011-01-25 ENCOUNTER — Ambulatory Visit (INDEPENDENT_AMBULATORY_CARE_PROVIDER_SITE_OTHER): Payer: 59 | Admitting: Family Medicine

## 2011-01-25 DIAGNOSIS — N186 End stage renal disease: Secondary | ICD-10-CM | POA: Insufficient documentation

## 2011-01-25 DIAGNOSIS — J019 Acute sinusitis, unspecified: Secondary | ICD-10-CM

## 2011-01-25 DIAGNOSIS — J329 Chronic sinusitis, unspecified: Secondary | ICD-10-CM | POA: Insufficient documentation

## 2011-01-25 DIAGNOSIS — I10 Essential (primary) hypertension: Secondary | ICD-10-CM

## 2011-02-01 NOTE — Progress Notes (Signed)
  Phone Note Call from Patient   Summary of Call: Patient called in and was supposed to be going to dialysis but she had been running a fever and wanted to know what to do. I advised her to call the dialysis center and check with them regarding their protocol. Initial call taken by: Everitt Amber LPN,  January 25, 2011 8:04 AM

## 2011-02-06 NOTE — Assessment & Plan Note (Signed)
Summary: SICK   Vital Signs:  Patient profile:   55 year old female Menstrual status:  postmenopausal Height:      64 inches Weight:      159 pounds BMI:     27.39 O2 Sat:      97 % Temp:     98.8 degrees F oral Pulse rate:   75 / minute Pulse rhythm:   regular Resp:     16 per minute BP sitting:   118 / 70  (left arm)  Vitals Entered By: Everitt Amber LPN (January 25, 2955 9:59 AM)  Nutrition Counseling: Patient's BMI is greater than 25 and therefore counseled on weight management options. CC: c/o low grade fever, alot of mucus in nose, really congested, cold chills at night x 3 days    Primary Care Provider:  Syliva Overman MD  CC:  c/o low grade fever, alot of mucus in nose, really congested, and cold chills at night x 3 days .  History of Present Illness: 5 day h/o sinus pressure thick mucuc draining down the throat, low grade fever and increased cough.  Denies chest congestion, or cough productive of sputum. Denies chest pain, palpitations, PND, orthopnea or leg swelling. Denies abdominal pain, nausea, vomitting, diarrhea or constipation. Denies change in bowel movements or bloody stool.  Denies  joint pain, swelling, or reduced mobility. Denies headaches, vertigo, seizures. Denies depression, anxiety or insomnia. Denies  rash, lesions, or itch.Pt hshad the new stress of starting dialysis this month,not unexpectedly, she laso has the offer of a kidney from her sibling , and will soon be on a transplant list    Allergies: 1)  ! Sulfa 2)  ! Ibuprofen  Past History:  Past medical, surgical, family and social histories (including risk factors) reviewed, and no changes noted (except as noted below).  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 tubal ligation 1986 c section 1985 shunt palced in left arm in July 05 2010 for future dialysis  Family History: Reviewed history from 04/28/2008 and no changes required. Father: prediabetic Mother: HTN, hyperlipidemia, thyroid disease Siblings: one sister died of CA of uterus, one living brother healthy  Social History: Reviewed history from 04/28/2008 and no changes required. Marital Status: Married Children: two sons Occupation: unemployed socially drinks alcohol Never Smoked Drug use-no  Review of Systems      See HPI General:  Complains of chills, fatigue, and malaise. ENT:  Complains of postnasal drainage, sinus pressure, and sore throat; denies decreased hearing, earache, hoarseness, and nosebleeds. Psych:  Complains of anxiety and depression; denies mental problems, panic attacks, suicidal thoughts/plans, thoughts of violence, and unusual visions or sounds; improved . Endo:  Denies cold intolerance, excessive hunger, excessive thirst, excessive urination, and heat intolerance. Heme:  Denies abnormal bruising, bleeding, and enlarge lymph nodes. Allergy:  Complains of seasonal allergies; denies hives or rash, itching eyes, and persistent infections.  Physical Exam  General:  Well-developed,well-nourished,in no acute distress; alert,appropriate and cooperative throughout examination HEENT: No facial asymmetry,  EOMI, positive sinus tenderness, TM's Clear, oropharynx  pink and moist.   Chest: Clear to auscultation bilaterally.  CVS: S1, S2, No murmurs, No S3.   Abd: Soft, Nontender.  MS: Adequate ROM spine, hips, shoulders and knees.  Ext: No  edema.   CNS: CN 2-12 intact, power tone and sensation normal throughout.   Skin: Intact, no visible lesions or rashes.  Psych: Good eye contact, normal affect.  Memory intact, not anxious or depressed appearing.    Impression &  Recommendations:  Problem # 1:  ESRD (ICD-585.6) Assessment Comment Only currently on dialysis  Problem # 2:  ACUTE SINUSITIS, UNSPECIFIED (ICD-461.9) Assessment: Comment Only  Her updated medication list for this problem includes:    Penicillin V Potassium 500 Mg Tabs (Penicillin v potassium) .Marland Kitchen... Take 1 tablet by mouth three times a day  Problem # 3:  DEPRESSION (ICD-311) Assessment: [facility]  Problem # 4:  HYPERTENSION (ICD-401.9) Assessment: Unchanged  Her updated medication list for this problem includes:    Amlodipine Besylate 10 Mg Tabs (Amlodipine besylate) ..... One half tab once daily    Metoprolol Succinate 100 Mg Xr24h-tab (Metoprolol succinate) ..... One half tab once daily    Furosemide 40 Mg Tabs (Furosemide) .Marland Kitchen... 1 tab twice daily  BP today: 118/70 Prior BP: 120/70 (10/09/2010)  Labs Reviewed: K+: 5.0 (09/30/2008) Creat: : 2.47 (09/30/2008)   Chol: 265 (09/30/2008)   HDL: 52 (09/30/2008)   LDL: 150 (09/30/2008)   TG: 313 (09/30/2008)  Problem # 5:  HYPERLIPIDEMIA (ICD-272.4) Assessment: Comment Only  Her updated medication list for this problem includes:    Pravastatin Sodium 40 Mg Tabs (Pravastatin sodium) .Marland Kitchen... Take 1 tab by mouth at bedtime followed at the [facility] Labs Reviewed: SGOT: 22 (09/30/2008)   SGPT: 18 (09/30/2008)   HDL:52 (09/30/2008), 47 (12/27/2006)  LDL:150 (09/30/2008), 121 (12/27/2006)  Chol:265 (09/30/2008), 213 (12/27/2006)  Trig:313 (09/30/2008), 227 (12/27/2006)  Complete Medication List: 1)  Pravastatin Sodium 40 Mg Tabs (Pravastatin sodium) .... Take 1 tab by mouth at bedtime 2)  Claritin 10 Mg Tabs (Loratadine) .... Take 1 tablet by mouth once a day 3)  Ergocalciferol 50000 Unit Caps (Ergocalciferol) .... One tab every week 4)  Sodium Bicarbonate 648 Mg Tabs (Sodium bicarbonate) .... Take 1 tablet by mouth two times a day 5)  Amlodipine Besylate 10 Mg Tabs (Amlodipine besylate) .... One half tab once  daily 6)  Omeprazole 20 Mg Cpdr (Omeprazole) .... Take 1 tablet by mouth once a day as needed 7)  Metoprolol Succinate 100 Mg Xr24h-tab (Metoprolol succinate) .... One half tab once daily 8)  Calcitriol 0.25 Mcg Caps (Calcitriol) .... Take 1 tablet by mouth once a day 9)  Renal Multivitamin  .Marland Kitchen.. 1 tab daily 10)  Fosrenol 500 Mg Chew (Lanthanum carbonate) .... Take as directed 11)  Furosemide 40 Mg Tabs (Furosemide) .Marland Kitchen.. 1 tab twice daily 12)  Penicillin V Potassium 500 Mg Tabs (Penicillin v potassium) .... Take 1 tablet by mouth three times a day 13)  Fluconazole 150 Mg Tabs (Fluconazole) .... Take 1 tablet by mouth once a day as needed for vaginal itch  Patient Instructions: 1)  f/u as needed.Marland Kitchen 2)  You are being treated for acute sinusitis. 3)  Med is being sent in , penicillin for 10 days, also med for vaginal itch in case you get this  4)  pls do normal saline flushes to the nostrils once or twice daily, we will give you a sheet to make your own solution and also a sample Prescriptions: FLUCONAZOLE 150 MG TABS (FLUCONAZOLE) Take 1 tablet by mouth once a day as needed for vaginal itch  #3 x 0   Entered and Authorized by:   Syliva Overman MD  Signed by:   Syliva Overman MD on 01/25/2011   Method used:   Electronically to        [facility] (retail)       [address]       [place], [place]  [address]       Ph: [phone]       Fax: [phone]   RxID:   [id number] PENICILLIN V POTASSIUM 500 MG TABS (PENICILLIN V POTASSIUM) Take 1 tablet by mouth three times a day  #30 x 0   Entered and Authorized by:   Syliva Overman MD   Signed by:   Syliva Overman MD on 01/25/2011   Method used:   Electronically to        [facility] (retail)       [address]       [place], [place]  [address]       Ph: [phone]       Fax: [phone]   RxID:   [id number]    Orders Added: 1)  Est. Patient Level IV  [19417]

## 2011-02-09 ENCOUNTER — Telehealth (INDEPENDENT_AMBULATORY_CARE_PROVIDER_SITE_OTHER): Payer: Self-pay | Admitting: *Deleted

## 2011-02-16 ENCOUNTER — Other Ambulatory Visit: Payer: Self-pay | Admitting: Family Medicine

## 2011-02-22 NOTE — Progress Notes (Signed)
Summary: wants to talk to Dr. Lodema Hong  Phone Note Call from Patient   Summary of Call: left message that she wants Dr. Lodema Hong to call her and that it is very important Initial call taken by: Lind Guest,  February 09, 2011 4:55 PM  Follow-up for Phone Call        pt reports that this Wednesday will make 2 weeks that she has swelling of the arm with redness,  Follow-up by: Syliva Overman MD,  February 12, 2011 8:21 AM    Additional Follow-up for Phone Call Additional follow up Details #2::    Dr. Lodema Hong confirmed she had conversation with Ms. Shiroma this morning and no further action is needed.     Appended Document: wants to talk to Dr. Lodema Hong Additional FU Details signed off on by Doneen Poisson 02/12/11  Appended Document: wants to talk to Dr. Lodema Hong Ms. Munroe confirmed with me that she would be seeing a doc at the dialysis center today for the doc there  to look at the area of concern.  I explained that there had been alot of misunderstanding and miscommunication surrounding the situation which started last Friday, 3 days ago. I advised it was never my intention to evaluate vascular injury and swelling per her report from the dialysis center. I advised that it would be much more appropriate to have the doc at the dialysis center evaluate  this , she agreed and stated she had already made an appt for today.

## 2011-03-12 ENCOUNTER — Ambulatory Visit: Payer: Self-pay | Admitting: Family Medicine

## 2011-03-13 ENCOUNTER — Encounter: Payer: Self-pay | Admitting: Family Medicine

## 2011-03-14 ENCOUNTER — Encounter: Payer: Self-pay | Admitting: Family Medicine

## 2011-03-14 ENCOUNTER — Ambulatory Visit: Payer: Self-pay | Admitting: Family Medicine

## 2011-07-02 ENCOUNTER — Telehealth: Payer: Self-pay | Admitting: Family Medicine

## 2011-07-06 NOTE — Telephone Encounter (Signed)
Faxed when her last flu shot was to [facility]

## 2011-07-12 ENCOUNTER — Other Ambulatory Visit: Payer: Self-pay | Admitting: Family Medicine

## 2011-07-12 ENCOUNTER — Telehealth: Payer: Self-pay | Admitting: Family Medicine

## 2011-07-12 DIAGNOSIS — Z1211 Encounter for screening for malignant neoplasm of colon: Secondary | ICD-10-CM

## 2011-07-12 NOTE — Telephone Encounter (Signed)
pls refer for screening colonscopy, in prep for renal transplant, a requirement per pt , pls refer to stated [facility]ILITY

## 2011-07-16 ENCOUNTER — Encounter: Payer: Self-pay | Admitting: Gastroenterology

## 2011-07-31 ENCOUNTER — Ambulatory Visit (AMBULATORY_SURGERY_CENTER): Payer: 59

## 2011-07-31 VITALS — Ht 64.0 in | Wt 158.9 lb

## 2011-07-31 DIAGNOSIS — Z1211 Encounter for screening for malignant neoplasm of colon: Secondary | ICD-10-CM

## 2011-07-31 MED ORDER — NA SULFATE-K SULFATE-MG SULF 17.5-3.13-1.6 GM/177ML PO SOLN: 1.0000 | Freq: Once | ORAL | Status: DC

## 2011-08-06 ENCOUNTER — Other Ambulatory Visit: Payer: 59 | Admitting: Gastroenterology

## 2011-08-07 ENCOUNTER — Telehealth: Payer: Self-pay

## 2011-08-07 NOTE — Telephone Encounter (Signed)
Spoke with pt regarding the Suprep and instructions she was given to take prior to her colonoscopy on 08/09/11.Explained to pt she would need to take the Moviprep instead of Suprep due to dialysis.I spoke with Jeanice Lim- the pharmacist at [facility] [phone]) and called in the new RX for Moviprep and faxed the new instructions and coupon for Moviprep to the pharmacist. (per pt's request). The pt will call back if she has any questions. Ulis Rias

## 2011-08-09 ENCOUNTER — Other Ambulatory Visit: Payer: 59 | Admitting: Gastroenterology

## 2011-08-16 ENCOUNTER — Encounter: Payer: Self-pay | Admitting: Gastroenterology

## 2011-08-16 ENCOUNTER — Ambulatory Visit (AMBULATORY_SURGERY_CENTER): Payer: 59 | Admitting: Gastroenterology

## 2011-08-16 VITALS — BP 159/91 | HR 80 | Temp 97.5°F | Resp 20 | Ht 64.0 in | Wt 158.0 lb

## 2011-08-16 DIAGNOSIS — D126 Benign neoplasm of colon, unspecified: Secondary | ICD-10-CM

## 2011-08-16 DIAGNOSIS — Z1211 Encounter for screening for malignant neoplasm of colon: Secondary | ICD-10-CM

## 2011-08-16 MED ORDER — SODIUM CHLORIDE 0.9 % IV SOLN: 500.0000 mL | INTRAVENOUS | Status: DC

## 2011-08-16 NOTE — Patient Instructions (Signed)
FOLLOW DISCHARGE INSTRUCTIONS (BLUE & GREEN SHEETS)   Handout information on polyps given to you.

## 2011-08-17 ENCOUNTER — Telehealth: Payer: Self-pay

## 2011-08-17 NOTE — Telephone Encounter (Signed)
No ID on answering machine. 

## 2011-08-22 ENCOUNTER — Telehealth: Payer: Self-pay | Admitting: Gastroenterology

## 2011-08-24 NOTE — Telephone Encounter (Signed)
You need to check the chart.  I sent out a letter yesterday - adenomatous polyp

## 2011-08-24 NOTE — Telephone Encounter (Signed)
L/M for pt that letter was mailed out to her yestedrday

## 2011-09-05 NOTE — Progress Notes (Signed)
Addended by: Maple Hudson on: 09/05/2011 03:09 PM   Modules accepted: Level of Service

## 2011-09-27 ENCOUNTER — Telehealth: Payer: Self-pay | Admitting: Family Medicine

## 2011-09-28 NOTE — Telephone Encounter (Signed)
I attempted to call back pt, left a message with her spouse that she call back next  Monday pm when I weill be in the office, in the pm or after hrs , I will have the pager

## 2011-10-01 NOTE — Telephone Encounter (Signed)
Pt states she is on the transplant list, hopes that she can find a compatible donor, she needs a carotid doppler in preparation, she already has an appt to get this tomorrow in [place] I advised to call and lv me a msg to call her when a donor kidney is identified and she knows he date of transplant

## 2012-03-18 ENCOUNTER — Ambulatory Visit (INDEPENDENT_AMBULATORY_CARE_PROVIDER_SITE_OTHER): Payer: Medicare Other | Admitting: Family Medicine

## 2012-03-18 ENCOUNTER — Encounter: Payer: Self-pay | Admitting: Family Medicine

## 2012-03-18 VITALS — BP 120/80 | HR 69 | Resp 16 | Ht 64.0 in | Wt 155.0 lb

## 2012-03-18 DIAGNOSIS — J309 Allergic rhinitis, unspecified: Secondary | ICD-10-CM | POA: Diagnosis not present

## 2012-03-18 DIAGNOSIS — F329 Major depressive disorder, single episode, unspecified: Secondary | ICD-10-CM

## 2012-03-18 DIAGNOSIS — I10 Essential (primary) hypertension: Secondary | ICD-10-CM

## 2012-03-18 DIAGNOSIS — N186 End stage renal disease: Secondary | ICD-10-CM

## 2012-03-18 DIAGNOSIS — E785 Hyperlipidemia, unspecified: Secondary | ICD-10-CM

## 2012-03-18 DIAGNOSIS — F3289 Other specified depressive episodes: Secondary | ICD-10-CM

## 2012-03-18 MED ORDER — CETIRIZINE HCL 10 MG PO CHEW: 10.0000 mg | CHEWABLE_TABLET | Freq: Every day | ORAL | Status: DC

## 2012-03-18 NOTE — Patient Instructions (Addendum)
F/u in 6 month  I am happy that you are getting well coordinated care through the [facility]  Medication is sent  In for allergies, zyrtec 1 daily during the bad seasons.  Use sudafed one daily as needed for excessive nasal drainage.  Blood pressure is excellent.  Please ensure that you resume care at the [facility] with a nephrologist, discuss this with your PCP.  I recommend   Cbc, fasting lipid and cmp, tsh and vit D labs to be checked, please ask the [facility] to sent your next lab  Last DEXA  Was 2008 , so you may need one

## 2012-03-18 NOTE — Progress Notes (Signed)
  Subjective:    Patient ID: Michaela [name], female    DOB: 1956-07-25, 56 y.o.   MRN: [id number]  HPI The PT is here for follow up and re-evaluation of chronic medical conditions, medication management and review of any available recent lab and radiology data.  Preventive health is updated, specifically  Cancer screening and Immunization. She  Is obtaining excellent care through the [place], has been on dialysis for approx 1 year and is awaiting a kidney transplant. Less stressed and denies depression  The PT denies any adverse reactions to current medications since the last visit.  There are no new concerns.  C/o increased nasal drainage , clear , and hd congestion, typical of the Spring       Review of Systems See HPI Denies recent fever or chills.  Denies chest congestion, productive cough or wheezing. Denies chest pains, palpitations and leg swelling Denies abdominal pain, nausea, vomiting,diarrhea or constipation.   Denies dysuria, frequency, hesitancy or incontinence.She still voids Denies joint pain, swelling and limitation in mobility. Denies headaches, seizures, numbness, or tingling. Denies depression, anxiety or insomnia. Denies skin break down or rash.        Objective:   Physical Exam  Patient alert and oriented and in no cardiopulmonary distress.  HEENT: No facial asymmetry, EOMI, no sinus tenderness,  oropharynx pink and moist.  Neck supple no adenopathy.  Chest: Clear to auscultation bilaterally.  CVS: S1, S2 systolic murmurs, no S3.  ABD: Soft non tender. Bowel sounds normal.  Ext: No edema  MS: Adequate ROM spine, shoulders, hips and knees.  Skin: Intact, no ulcerations or rash noted.  Psych: Good eye contact, normal affect. Memory intact not anxious or depressed appearing.  CNS: CN 2-12 intact, power, tone and sensation normal throughout.       Assessment & Plan:

## 2012-04-09 ENCOUNTER — Encounter: Payer: Self-pay | Admitting: Family Medicine

## 2012-04-09 NOTE — Assessment & Plan Note (Signed)
Improved, on no medication 

## 2012-04-09 NOTE — Assessment & Plan Note (Signed)
Hyperlipidemia:Low fat diet discussed and encouraged.  Well controlled, per pt from labs at the [facility], not available at visit

## 2012-04-09 NOTE — Assessment & Plan Note (Signed)
Controlled, no change in medication  

## 2012-04-09 NOTE — Assessment & Plan Note (Signed)
Uncontrolled , medication prescribed 

## 2012-04-09 NOTE — Assessment & Plan Note (Signed)
On dialysis through the [place], awaiting kidney transplant

## 2012-09-25 ENCOUNTER — Encounter: Payer: Self-pay | Admitting: Family Medicine

## 2012-09-25 ENCOUNTER — Ambulatory Visit (INDEPENDENT_AMBULATORY_CARE_PROVIDER_SITE_OTHER): Payer: Medicare Other | Admitting: Family Medicine

## 2012-09-25 VITALS — BP 150/80 | HR 70 | Resp 18 | Ht 64.0 in | Wt 152.1 lb

## 2012-09-25 DIAGNOSIS — F329 Major depressive disorder, single episode, unspecified: Secondary | ICD-10-CM

## 2012-09-25 DIAGNOSIS — J309 Allergic rhinitis, unspecified: Secondary | ICD-10-CM | POA: Diagnosis not present

## 2012-09-25 DIAGNOSIS — I10 Essential (primary) hypertension: Secondary | ICD-10-CM | POA: Diagnosis not present

## 2012-09-25 DIAGNOSIS — R5381 Other malaise: Secondary | ICD-10-CM

## 2012-09-25 DIAGNOSIS — R5383 Other fatigue: Secondary | ICD-10-CM

## 2012-09-25 DIAGNOSIS — E785 Hyperlipidemia, unspecified: Secondary | ICD-10-CM

## 2012-09-25 DIAGNOSIS — N186 End stage renal disease: Secondary | ICD-10-CM

## 2012-09-25 MED ORDER — FLUTICASONE PROPIONATE 50 MCG/ACT NA SUSP: 1.0000 | Freq: Every day | NASAL | Status: DC

## 2012-09-25 NOTE — Patient Instructions (Signed)
F/U in 6 month or as needed  Please call if you need me before  I am thankful that you are doing well and are having excellent care.  You can take sudafed one tablet  Once daily as needed for excessive nasal drainage, also daily use of a steroid nasal spray, prescription, will help

## 2012-09-27 NOTE — Assessment & Plan Note (Signed)
Hyperlipidemia:Low fat diet discussed and encouraged.  Labs through the [place]

## 2012-09-27 NOTE — Assessment & Plan Note (Signed)
Symptomatic at times, but not consistenetly

## 2012-09-27 NOTE — Assessment & Plan Note (Signed)
Controlled, no change in medication  

## 2012-09-27 NOTE — Assessment & Plan Note (Signed)
Improved, sees therapist every 2 weeks, on no meds

## 2012-09-27 NOTE — Assessment & Plan Note (Signed)
Uncontrolled, no med change. Pt has ESRD , awaiting a kidney, followed primarily at the [facility]

## 2012-09-27 NOTE — Progress Notes (Signed)
  Subjective:    Patient ID: Michaela [name], female    DOB: [birth year], 56 y.o.   MRN: [id number]  HPI  The PT is here for follow up and re-evaluation of chronic medical conditions, medication management and review of any available recent lab and radiology data.  Preventive health is updated, specifically  Cancer screening and Immunization.   Questions or concerns regarding consultations or procedures which the PT has had in the interim are  addressed. Pt [facility] through the [facility] and is extremely happy with the care she is receiving. She has no current health concerns to discuss. She is still waiting on a donor kidney, has become accustomed to dialysis, at times she experiences fatigue associated with it All cancer screening and lab work is reportedly up to date and acceptable    Review of Systems See HPI Denies recent fever or chills. Denies sinus pressure, nasal congestion, ear pain or sore throat. Denies chest congestion, productive cough or wheezing. Denies chest pains, palpitations and leg swelling Denies abdominal pain, nausea, vomiting,diarrhea or constipation.   Denies dysuria, frequency, hesitancy or incontinence. Denies joint pain, swelling and limitation in mobility. Denies headaches, seizures, numbness, or tingling. Denies uncontrolled  depression, anxiety or insomnia. Denies skin break down or rash.        Objective:   Physical Exam  Patient alert and oriented and in no cardiopulmonary distress.  HEENT: No facial asymmetry, EOMI, no sinus tenderness,  oropharynx pink and moist.  Neck supple no adenopathy.  Chest: Clear to auscultation bilaterally.  CVS: S1, S2 no murmurs, no S3.  ABD: Soft non tender. Bowel sounds normal.  Ext: No edema  MS: Adequate ROM spine, shoulders, hips and knees.  Skin: Intact, no ulcerations or rash noted.  Psych: Good eye contact, normal affect. Memory intact not anxious or depressed appearing.  CNS: CN 2-12 intact,  power, tone and sensation normal throughout.       Assessment & Plan:

## 2012-09-27 NOTE — Assessment & Plan Note (Signed)
Tolerating dialysis well, and is on transplant list

## 2012-10-02 DIAGNOSIS — N186 End stage renal disease: Secondary | ICD-10-CM | POA: Diagnosis not present

## 2012-10-02 DIAGNOSIS — Z992 Dependence on renal dialysis: Secondary | ICD-10-CM | POA: Diagnosis not present

## 2012-10-02 DIAGNOSIS — J90 Pleural effusion, not elsewhere classified: Secondary | ICD-10-CM | POA: Diagnosis not present

## 2012-10-02 DIAGNOSIS — N189 Chronic kidney disease, unspecified: Secondary | ICD-10-CM | POA: Diagnosis not present

## 2012-10-02 DIAGNOSIS — J811 Chronic pulmonary edema: Secondary | ICD-10-CM | POA: Diagnosis not present

## 2012-10-02 DIAGNOSIS — N039 Chronic nephritic syndrome with unspecified morphologic changes: Secondary | ICD-10-CM | POA: Diagnosis present

## 2012-10-02 DIAGNOSIS — E785 Hyperlipidemia, unspecified: Secondary | ICD-10-CM | POA: Diagnosis present

## 2012-10-02 DIAGNOSIS — Z940 Kidney transplant status: Secondary | ICD-10-CM | POA: Diagnosis not present

## 2012-10-02 DIAGNOSIS — N2581 Secondary hyperparathyroidism of renal origin: Secondary | ICD-10-CM | POA: Diagnosis present

## 2012-10-02 DIAGNOSIS — Z09 Encounter for follow-up examination after completed treatment for conditions other than malignant neoplasm: Secondary | ICD-10-CM | POA: Diagnosis not present

## 2012-10-02 DIAGNOSIS — I120 Hypertensive chronic kidney disease with stage 5 chronic kidney disease or end stage renal disease: Secondary | ICD-10-CM | POA: Diagnosis not present

## 2012-10-02 DIAGNOSIS — D631 Anemia in chronic kidney disease: Secondary | ICD-10-CM | POA: Diagnosis not present

## 2012-10-02 DIAGNOSIS — Z01810 Encounter for preprocedural cardiovascular examination: Secondary | ICD-10-CM | POA: Diagnosis not present

## 2012-10-02 DIAGNOSIS — Z79899 Other long term (current) drug therapy: Secondary | ICD-10-CM | POA: Diagnosis not present

## 2012-10-02 DIAGNOSIS — K219 Gastro-esophageal reflux disease without esophagitis: Secondary | ICD-10-CM | POA: Diagnosis present

## 2012-10-02 DIAGNOSIS — R05 Cough: Secondary | ICD-10-CM | POA: Diagnosis not present

## 2012-10-02 DIAGNOSIS — D62 Acute posthemorrhagic anemia: Secondary | ICD-10-CM | POA: Diagnosis not present

## 2012-10-03 HISTORY — PX: KIDNEY TRANSPLANT: SHX239

## 2012-10-10 DIAGNOSIS — D649 Anemia, unspecified: Secondary | ICD-10-CM | POA: Diagnosis not present

## 2012-10-10 DIAGNOSIS — N186 End stage renal disease: Secondary | ICD-10-CM | POA: Diagnosis not present

## 2012-10-10 DIAGNOSIS — E559 Vitamin D deficiency, unspecified: Secondary | ICD-10-CM | POA: Diagnosis not present

## 2012-10-10 DIAGNOSIS — Z940 Kidney transplant status: Secondary | ICD-10-CM | POA: Diagnosis not present

## 2012-10-10 DIAGNOSIS — E860 Dehydration: Secondary | ICD-10-CM | POA: Diagnosis not present

## 2012-10-10 DIAGNOSIS — Z8719 Personal history of other diseases of the digestive system: Secondary | ICD-10-CM | POA: Diagnosis not present

## 2012-10-10 DIAGNOSIS — Z79899 Other long term (current) drug therapy: Secondary | ICD-10-CM | POA: Diagnosis not present

## 2012-10-10 DIAGNOSIS — Z48298 Encounter for aftercare following other organ transplant: Secondary | ICD-10-CM | POA: Diagnosis not present

## 2012-10-10 DIAGNOSIS — I120 Hypertensive chronic kidney disease with stage 5 chronic kidney disease or end stage renal disease: Secondary | ICD-10-CM | POA: Diagnosis not present

## 2012-10-10 DIAGNOSIS — N2581 Secondary hyperparathyroidism of renal origin: Secondary | ICD-10-CM | POA: Diagnosis not present

## 2012-10-10 DIAGNOSIS — N049 Nephrotic syndrome with unspecified morphologic changes: Secondary | ICD-10-CM | POA: Diagnosis not present

## 2012-10-13 DIAGNOSIS — Z48298 Encounter for aftercare following other organ transplant: Secondary | ICD-10-CM | POA: Diagnosis not present

## 2012-10-13 DIAGNOSIS — Z79899 Other long term (current) drug therapy: Secondary | ICD-10-CM | POA: Diagnosis not present

## 2012-10-13 DIAGNOSIS — I10 Essential (primary) hypertension: Secondary | ICD-10-CM | POA: Diagnosis not present

## 2012-10-13 DIAGNOSIS — E785 Hyperlipidemia, unspecified: Secondary | ICD-10-CM | POA: Diagnosis not present

## 2012-10-13 DIAGNOSIS — Z940 Kidney transplant status: Secondary | ICD-10-CM | POA: Diagnosis not present

## 2012-10-15 DIAGNOSIS — Z940 Kidney transplant status: Secondary | ICD-10-CM | POA: Diagnosis not present

## 2012-10-15 DIAGNOSIS — Z79899 Other long term (current) drug therapy: Secondary | ICD-10-CM | POA: Diagnosis not present

## 2012-10-17 DIAGNOSIS — R112 Nausea with vomiting, unspecified: Secondary | ICD-10-CM | POA: Diagnosis not present

## 2012-10-17 DIAGNOSIS — E559 Vitamin D deficiency, unspecified: Secondary | ICD-10-CM | POA: Diagnosis not present

## 2012-10-17 DIAGNOSIS — Z9851 Tubal ligation status: Secondary | ICD-10-CM | POA: Diagnosis not present

## 2012-10-17 DIAGNOSIS — Z940 Kidney transplant status: Secondary | ICD-10-CM | POA: Diagnosis not present

## 2012-10-17 DIAGNOSIS — Z8719 Personal history of other diseases of the digestive system: Secondary | ICD-10-CM | POA: Diagnosis not present

## 2012-10-17 DIAGNOSIS — E785 Hyperlipidemia, unspecified: Secondary | ICD-10-CM | POA: Diagnosis not present

## 2012-10-17 DIAGNOSIS — Z79899 Other long term (current) drug therapy: Secondary | ICD-10-CM | POA: Diagnosis not present

## 2012-10-17 DIAGNOSIS — O0289 Other abnormal products of conception: Secondary | ICD-10-CM | POA: Diagnosis not present

## 2012-10-21 ENCOUNTER — Telehealth: Payer: Self-pay | Admitting: Family Medicine

## 2012-11-04 NOTE — Telephone Encounter (Signed)
I am trying to spk with pt to discuss the handicap sticker have left message, pls get me to the phone when she calls

## 2012-11-10 DIAGNOSIS — Z79899 Other long term (current) drug therapy: Secondary | ICD-10-CM | POA: Diagnosis not present

## 2012-11-10 DIAGNOSIS — N170 Acute kidney failure with tubular necrosis: Secondary | ICD-10-CM | POA: Diagnosis not present

## 2012-11-10 DIAGNOSIS — Z940 Kidney transplant status: Secondary | ICD-10-CM | POA: Diagnosis not present

## 2012-11-17 DIAGNOSIS — Z48298 Encounter for aftercare following other organ transplant: Secondary | ICD-10-CM | POA: Diagnosis not present

## 2012-11-17 DIAGNOSIS — Z79899 Other long term (current) drug therapy: Secondary | ICD-10-CM | POA: Diagnosis not present

## 2012-11-17 DIAGNOSIS — I10 Essential (primary) hypertension: Secondary | ICD-10-CM | POA: Diagnosis not present

## 2012-11-17 DIAGNOSIS — Z940 Kidney transplant status: Secondary | ICD-10-CM | POA: Diagnosis not present

## 2012-12-08 DIAGNOSIS — Z940 Kidney transplant status: Secondary | ICD-10-CM | POA: Diagnosis not present

## 2012-12-08 DIAGNOSIS — N2581 Secondary hyperparathyroidism of renal origin: Secondary | ICD-10-CM | POA: Diagnosis not present

## 2012-12-08 DIAGNOSIS — N049 Nephrotic syndrome with unspecified morphologic changes: Secondary | ICD-10-CM | POA: Diagnosis not present

## 2012-12-08 DIAGNOSIS — Z9851 Tubal ligation status: Secondary | ICD-10-CM | POA: Diagnosis not present

## 2012-12-08 DIAGNOSIS — O0289 Other abnormal products of conception: Secondary | ICD-10-CM | POA: Diagnosis not present

## 2012-12-08 DIAGNOSIS — Z48298 Encounter for aftercare following other organ transplant: Secondary | ICD-10-CM | POA: Diagnosis not present

## 2012-12-08 DIAGNOSIS — E872 Acidosis: Secondary | ICD-10-CM | POA: Diagnosis not present

## 2012-12-08 DIAGNOSIS — E559 Vitamin D deficiency, unspecified: Secondary | ICD-10-CM | POA: Diagnosis not present

## 2012-12-08 DIAGNOSIS — Z79899 Other long term (current) drug therapy: Secondary | ICD-10-CM | POA: Diagnosis not present

## 2012-12-15 DIAGNOSIS — Z79899 Other long term (current) drug therapy: Secondary | ICD-10-CM | POA: Diagnosis not present

## 2012-12-15 DIAGNOSIS — Z940 Kidney transplant status: Secondary | ICD-10-CM | POA: Diagnosis not present

## 2012-12-22 DIAGNOSIS — B459 Cryptococcosis, unspecified: Secondary | ICD-10-CM | POA: Diagnosis not present

## 2012-12-22 DIAGNOSIS — G4733 Obstructive sleep apnea (adult) (pediatric): Secondary | ICD-10-CM | POA: Diagnosis not present

## 2012-12-22 DIAGNOSIS — Z940 Kidney transplant status: Secondary | ICD-10-CM | POA: Diagnosis not present

## 2013-01-05 DIAGNOSIS — E785 Hyperlipidemia, unspecified: Secondary | ICD-10-CM | POA: Diagnosis not present

## 2013-01-05 DIAGNOSIS — I10 Essential (primary) hypertension: Secondary | ICD-10-CM | POA: Diagnosis not present

## 2013-01-05 DIAGNOSIS — Z940 Kidney transplant status: Secondary | ICD-10-CM | POA: Diagnosis not present

## 2013-01-05 DIAGNOSIS — Z79899 Other long term (current) drug therapy: Secondary | ICD-10-CM | POA: Diagnosis not present

## 2013-01-19 DIAGNOSIS — Z9851 Tubal ligation status: Secondary | ICD-10-CM | POA: Diagnosis not present

## 2013-01-19 DIAGNOSIS — Z79899 Other long term (current) drug therapy: Secondary | ICD-10-CM | POA: Diagnosis not present

## 2013-01-19 DIAGNOSIS — N2581 Secondary hyperparathyroidism of renal origin: Secondary | ICD-10-CM | POA: Diagnosis not present

## 2013-01-19 DIAGNOSIS — Z940 Kidney transplant status: Secondary | ICD-10-CM | POA: Diagnosis not present

## 2013-01-19 DIAGNOSIS — O0289 Other abnormal products of conception: Secondary | ICD-10-CM | POA: Diagnosis not present

## 2013-01-19 DIAGNOSIS — I10 Essential (primary) hypertension: Secondary | ICD-10-CM | POA: Diagnosis not present

## 2013-01-19 DIAGNOSIS — E785 Hyperlipidemia, unspecified: Secondary | ICD-10-CM | POA: Diagnosis not present

## 2013-02-02 DIAGNOSIS — Z79899 Other long term (current) drug therapy: Secondary | ICD-10-CM | POA: Diagnosis not present

## 2013-02-02 DIAGNOSIS — Z940 Kidney transplant status: Secondary | ICD-10-CM | POA: Diagnosis not present

## 2013-02-02 DIAGNOSIS — I10 Essential (primary) hypertension: Secondary | ICD-10-CM | POA: Diagnosis not present

## 2013-02-02 DIAGNOSIS — E872 Acidosis: Secondary | ICD-10-CM | POA: Diagnosis not present

## 2013-02-02 DIAGNOSIS — E785 Hyperlipidemia, unspecified: Secondary | ICD-10-CM | POA: Diagnosis not present

## 2013-02-16 DIAGNOSIS — N183 Chronic kidney disease, stage 3 unspecified: Secondary | ICD-10-CM | POA: Diagnosis not present

## 2013-02-16 DIAGNOSIS — Z940 Kidney transplant status: Secondary | ICD-10-CM | POA: Diagnosis not present

## 2013-03-11 DIAGNOSIS — N2581 Secondary hyperparathyroidism of renal origin: Secondary | ICD-10-CM | POA: Diagnosis not present

## 2013-03-11 DIAGNOSIS — Z79899 Other long term (current) drug therapy: Secondary | ICD-10-CM | POA: Diagnosis not present

## 2013-03-11 DIAGNOSIS — Z940 Kidney transplant status: Secondary | ICD-10-CM | POA: Diagnosis not present

## 2013-03-11 DIAGNOSIS — E785 Hyperlipidemia, unspecified: Secondary | ICD-10-CM | POA: Diagnosis not present

## 2013-03-16 DIAGNOSIS — N183 Chronic kidney disease, stage 3 unspecified: Secondary | ICD-10-CM | POA: Diagnosis not present

## 2013-03-16 DIAGNOSIS — Z940 Kidney transplant status: Secondary | ICD-10-CM | POA: Diagnosis not present

## 2013-03-24 ENCOUNTER — Ambulatory Visit: Payer: 59 | Admitting: Family Medicine

## 2013-04-07 DIAGNOSIS — Z79899 Other long term (current) drug therapy: Secondary | ICD-10-CM | POA: Diagnosis not present

## 2013-04-07 DIAGNOSIS — Z940 Kidney transplant status: Secondary | ICD-10-CM | POA: Diagnosis not present

## 2013-04-10 DIAGNOSIS — N183 Chronic kidney disease, stage 3 unspecified: Secondary | ICD-10-CM | POA: Diagnosis not present

## 2013-04-10 DIAGNOSIS — Z940 Kidney transplant status: Secondary | ICD-10-CM | POA: Diagnosis not present

## 2013-04-13 ENCOUNTER — Telehealth: Payer: Self-pay | Admitting: Family Medicine

## 2013-04-13 DIAGNOSIS — Z79899 Other long term (current) drug therapy: Secondary | ICD-10-CM | POA: Diagnosis not present

## 2013-04-13 DIAGNOSIS — Z940 Kidney transplant status: Secondary | ICD-10-CM | POA: Diagnosis not present

## 2013-04-15 DIAGNOSIS — F329 Major depressive disorder, single episode, unspecified: Secondary | ICD-10-CM | POA: Diagnosis not present

## 2013-04-15 NOTE — Telephone Encounter (Signed)
She wants to know about Michaela [name] and his hypercalcemia. She said he was sent to Dr Fransico Him and she wants to know what he said RE this

## 2013-04-15 NOTE — Telephone Encounter (Signed)
Spoke with pt , she is aware that he is being further investigated, and will attend the f/u visit when results and management will be discussed

## 2013-05-06 ENCOUNTER — Encounter: Payer: Self-pay | Admitting: Family Medicine

## 2013-05-06 ENCOUNTER — Ambulatory Visit (INDEPENDENT_AMBULATORY_CARE_PROVIDER_SITE_OTHER): Payer: Medicare Other | Admitting: Family Medicine

## 2013-05-06 VITALS — BP 124/78 | HR 82 | Resp 16 | Wt 161.4 lb

## 2013-05-06 DIAGNOSIS — I10 Essential (primary) hypertension: Secondary | ICD-10-CM

## 2013-05-06 DIAGNOSIS — E785 Hyperlipidemia, unspecified: Secondary | ICD-10-CM

## 2013-05-06 DIAGNOSIS — N951 Menopausal and female climacteric states: Secondary | ICD-10-CM

## 2013-05-06 DIAGNOSIS — F329 Major depressive disorder, single episode, unspecified: Secondary | ICD-10-CM

## 2013-05-06 DIAGNOSIS — Z940 Kidney transplant status: Secondary | ICD-10-CM | POA: Diagnosis not present

## 2013-05-06 DIAGNOSIS — N186 End stage renal disease: Secondary | ICD-10-CM

## 2013-05-06 NOTE — Progress Notes (Signed)
  Subjective:    Patient ID: Michaela [name], female    DOB: [birth year], 57 y.o.   MRN: [id number]  HPI  The PT is here for follow up and re-evaluation of chronic medical conditions, medication management and review of any available recent lab and radiology data.  Preventive health is updated, specifically  Cancer screening and Immunization.   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  Hot flashes and mood instability x 6 month     Review of Systems See HPI Denies recent fever or chills. Denies sinus pressure, nasal congestion, ear pain or sore throat. Denies chest congestion, productive cough or wheezing. Denies chest pains, palpitations and leg swelling Denies abdominal pain, nausea, vomiting,diarrhea or constipation.   Denies dysuria, frequency, hesitancy or incontinence. Denies joint pain, swelling and limitation in mobility. Denies headaches, seizures, numbness, or tingling. Denies skin break down or rash.        Objective:   Physical Exam  Patient alert and oriented and in no cardiopulmonary distress.  HEENT: No facial asymmetry, EOMI, no sinus tenderness,  oropharynx pink and moist.  Neck supple no adenopathy.  Chest: Clear to auscultation bilaterally.  CVS: S1, S2 no murmurs, no S3.  ABD: Soft non tender. Bowel sounds normal.  Ext: No edema  MS: Adequate ROM spine, shoulders, hips and knees.  Skin: Intact, no ulcerations or rash noted.  Psych: Good eye contact, normal affect. Memory intact not anxious or depressed appearing.  CNS: CN 2-12 intact, power, tone and sensation normal throughout.       Assessment & Plan:

## 2013-05-06 NOTE — Patient Instructions (Addendum)
F/u in 6 month  I believe that your hot flashes and mood instability are due to changes in your hormone levels. You will get information on menopausal symptoms and management  Call if you decide that you need medication to help with symptoms   It is important that you exercise regularly at least 30 minutes 5 times a week. If you develop chest pain, have severe difficulty breathing, or feel very tired, stop exercising immediately and seek medical attention   I am thankful for the fact that you were able to get the kidney transplant

## 2013-05-15 DIAGNOSIS — Z79899 Other long term (current) drug therapy: Secondary | ICD-10-CM | POA: Diagnosis not present

## 2013-05-15 DIAGNOSIS — Z940 Kidney transplant status: Secondary | ICD-10-CM | POA: Diagnosis not present

## 2013-05-16 DIAGNOSIS — Z940 Kidney transplant status: Secondary | ICD-10-CM | POA: Insufficient documentation

## 2013-05-16 DIAGNOSIS — N951 Menopausal and female climacteric states: Secondary | ICD-10-CM | POA: Insufficient documentation

## 2013-05-16 NOTE — Assessment & Plan Note (Signed)
Controlled off of medication, improved since getting kidney transplant

## 2013-05-16 NOTE — Assessment & Plan Note (Signed)
Doing very well from renal standpoint, followed through the [facility]

## 2013-05-16 NOTE — Assessment & Plan Note (Signed)
Controlled, no change in medication DASH diet and commitment to daily physical activity for a minimum of 30 minutes discussed and encouraged, as a part of hypertension management. The importance of attaining a healthy weight is also discussed.  

## 2013-05-16 NOTE — Assessment & Plan Note (Signed)
Increased hot flashes and mood instability, wants natural non drug management, counseled re same and provided with material, will call if no relief or worsenig symtoms for short term help with med ( not estrogen)

## 2013-05-16 NOTE — Assessment & Plan Note (Signed)
Hyperlipidemia:Low fat diet discussed and encouraged.  Updated lab as soon as possible, most of her care is through the [facility]

## 2013-06-19 DIAGNOSIS — Z79899 Other long term (current) drug therapy: Secondary | ICD-10-CM | POA: Diagnosis not present

## 2013-06-19 DIAGNOSIS — Z940 Kidney transplant status: Secondary | ICD-10-CM | POA: Diagnosis not present

## 2013-07-10 DIAGNOSIS — Z940 Kidney transplant status: Secondary | ICD-10-CM | POA: Diagnosis not present

## 2013-08-05 DIAGNOSIS — Z940 Kidney transplant status: Secondary | ICD-10-CM | POA: Diagnosis not present

## 2013-08-05 DIAGNOSIS — Z79899 Other long term (current) drug therapy: Secondary | ICD-10-CM | POA: Diagnosis not present

## 2013-09-03 ENCOUNTER — Telehealth: Payer: Self-pay | Admitting: Family Medicine

## 2013-09-03 NOTE — Telephone Encounter (Signed)
noted 

## 2013-09-09 ENCOUNTER — Ambulatory Visit: Payer: Medicare Other | Admitting: Family Medicine

## 2013-09-09 ENCOUNTER — Encounter (INDEPENDENT_AMBULATORY_CARE_PROVIDER_SITE_OTHER): Payer: Self-pay

## 2013-09-09 ENCOUNTER — Ambulatory Visit (INDEPENDENT_AMBULATORY_CARE_PROVIDER_SITE_OTHER): Payer: Medicare Other

## 2013-09-09 DIAGNOSIS — Z23 Encounter for immunization: Secondary | ICD-10-CM

## 2013-09-16 ENCOUNTER — Other Ambulatory Visit: Payer: Self-pay

## 2013-09-16 ENCOUNTER — Other Ambulatory Visit: Payer: Self-pay | Admitting: Family Medicine

## 2013-09-16 DIAGNOSIS — Z1231 Encounter for screening mammogram for malignant neoplasm of breast: Secondary | ICD-10-CM

## 2013-09-28 DIAGNOSIS — Z7982 Long term (current) use of aspirin: Secondary | ICD-10-CM | POA: Diagnosis not present

## 2013-09-28 DIAGNOSIS — D899 Disorder involving the immune mechanism, unspecified: Secondary | ICD-10-CM | POA: Diagnosis not present

## 2013-09-28 DIAGNOSIS — E785 Hyperlipidemia, unspecified: Secondary | ICD-10-CM | POA: Diagnosis not present

## 2013-09-28 DIAGNOSIS — Z882 Allergy status to sulfonamides status: Secondary | ICD-10-CM | POA: Diagnosis not present

## 2013-09-28 DIAGNOSIS — IMO0002 Reserved for concepts with insufficient information to code with codable children: Secondary | ICD-10-CM | POA: Diagnosis not present

## 2013-09-28 DIAGNOSIS — Z792 Long term (current) use of antibiotics: Secondary | ICD-10-CM | POA: Diagnosis not present

## 2013-09-28 DIAGNOSIS — Z6828 Body mass index (BMI) 28.0-28.9, adult: Secondary | ICD-10-CM | POA: Diagnosis not present

## 2013-09-28 DIAGNOSIS — Z9109 Other allergy status, other than to drugs and biological substances: Secondary | ICD-10-CM | POA: Diagnosis not present

## 2013-09-28 DIAGNOSIS — Z79899 Other long term (current) drug therapy: Secondary | ICD-10-CM | POA: Diagnosis not present

## 2013-09-28 DIAGNOSIS — R635 Abnormal weight gain: Secondary | ICD-10-CM | POA: Diagnosis not present

## 2013-09-28 DIAGNOSIS — Z48298 Encounter for aftercare following other organ transplant: Secondary | ICD-10-CM | POA: Diagnosis not present

## 2013-09-28 DIAGNOSIS — Z940 Kidney transplant status: Secondary | ICD-10-CM | POA: Diagnosis not present

## 2013-09-28 DIAGNOSIS — I10 Essential (primary) hypertension: Secondary | ICD-10-CM | POA: Diagnosis not present

## 2013-10-06 ENCOUNTER — Ambulatory Visit (INDEPENDENT_AMBULATORY_CARE_PROVIDER_SITE_OTHER): Payer: Medicare Other | Admitting: Family Medicine

## 2013-10-06 ENCOUNTER — Encounter: Payer: Self-pay | Admitting: Family Medicine

## 2013-10-06 VITALS — BP 124/70 | HR 65 | Resp 16 | Ht 64.0 in | Wt 164.4 lb

## 2013-10-06 DIAGNOSIS — F329 Major depressive disorder, single episode, unspecified: Secondary | ICD-10-CM | POA: Diagnosis not present

## 2013-10-06 DIAGNOSIS — Z139 Encounter for screening, unspecified: Secondary | ICD-10-CM | POA: Diagnosis not present

## 2013-10-06 DIAGNOSIS — E785 Hyperlipidemia, unspecified: Secondary | ICD-10-CM

## 2013-10-06 DIAGNOSIS — I10 Essential (primary) hypertension: Secondary | ICD-10-CM | POA: Diagnosis not present

## 2013-10-06 LAB — LIPID PANEL
Cholesterol: 214 mg/dL — ABNORMAL HIGH (ref 0–200)
HDL: 36 mg/dL — ABNORMAL LOW (ref >39)
LDL Cholesterol: 117 mg/dL — ABNORMAL HIGH (ref 0–99)
Total CHOL/HDL Ratio: 5.9 Ratio
Triglycerides: 304 mg/dL — ABNORMAL HIGH (ref <150)
VLDL: 61 mg/dL — ABNORMAL HIGH (ref 0–40)

## 2013-10-06 NOTE — Patient Instructions (Signed)
F/u in 4 month, call if you need me before  Fasting lipid, HBA1C today   Reduce cheese, butter, egg yolk,margerine, triglyceride and fats are too high  Fasting lipid, cmp and TSH, in 4 month
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
## 2013-10-09 DIAGNOSIS — B259 Cytomegaloviral disease, unspecified: Secondary | ICD-10-CM | POA: Diagnosis not present

## 2013-10-09 DIAGNOSIS — Z940 Kidney transplant status: Secondary | ICD-10-CM | POA: Diagnosis not present

## 2013-10-11 NOTE — Assessment & Plan Note (Signed)
Controlled, no change in medication DASH diet and commitment to daily physical activity for a minimum of 30 minutes discussed and encouraged, as a part of hypertension management. The importance of attaining a healthy weight is also discussed.  
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
## 2013-10-11 NOTE — Assessment & Plan Note (Signed)
Uncontrolled Hyperlipidemia:Low fat diet discussed and encouraged.  Pt on medication, updated lab in 4 month

## 2013-10-13 ENCOUNTER — Ambulatory Visit
Admission: RE | Admit: 2013-10-13 | Discharge: 2013-10-13 | Disposition: A | Discharge status: Home / Self Care | Payer: Medicare Other | Source: Ambulatory Visit

## 2013-10-13 DIAGNOSIS — Z1231 Encounter for screening mammogram for malignant neoplasm of breast: Secondary | ICD-10-CM | POA: Diagnosis not present

## 2013-10-28 ENCOUNTER — Telehealth: Payer: Self-pay

## 2013-10-28 MED ORDER — CEPHALEXIN 500 MG PO CAPS: 500.0000 mg | ORAL_CAPSULE | Freq: Three times a day (TID) | ORAL | Status: AC

## 2013-10-28 NOTE — Telephone Encounter (Signed)
Advise soaks in warm salt water twice daily , and keflex 500mg  twice daily for 5 days. She is a transdplant pt and on immunosuppressive therapy so i am giving opral antibiotics as well
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
## 2013-10-28 NOTE — Telephone Encounter (Signed)
Patient states that she had and ingrown nail on her left thumb.  She trimmed it and now her finger is sore.   Recommended that patient use neosporin on the area.  Please advise if there is anything further.

## 2013-12-22 DIAGNOSIS — N183 Chronic kidney disease, stage 3 unspecified: Secondary | ICD-10-CM | POA: Diagnosis not present

## 2013-12-22 DIAGNOSIS — Z940 Kidney transplant status: Secondary | ICD-10-CM | POA: Diagnosis not present

## 2013-12-22 DIAGNOSIS — B259 Cytomegaloviral disease, unspecified: Secondary | ICD-10-CM | POA: Diagnosis not present

## 2013-12-22 DIAGNOSIS — I10 Essential (primary) hypertension: Secondary | ICD-10-CM | POA: Diagnosis not present

## 2013-12-22 DIAGNOSIS — I129 Hypertensive chronic kidney disease with stage 1 through stage 4 chronic kidney disease, or unspecified chronic kidney disease: Secondary | ICD-10-CM | POA: Diagnosis not present

## 2014-02-03 ENCOUNTER — Encounter: Payer: Self-pay | Admitting: Family Medicine

## 2014-02-03 ENCOUNTER — Encounter (INDEPENDENT_AMBULATORY_CARE_PROVIDER_SITE_OTHER): Payer: Self-pay

## 2014-02-03 ENCOUNTER — Ambulatory Visit (INDEPENDENT_AMBULATORY_CARE_PROVIDER_SITE_OTHER): Payer: Medicare Other | Admitting: Family Medicine

## 2014-02-03 ENCOUNTER — Other Ambulatory Visit: Payer: Self-pay | Admitting: Family Medicine

## 2014-02-03 VITALS — BP 140/80 | HR 69 | Resp 16 | Ht 64.0 in | Wt 168.8 lb

## 2014-02-03 DIAGNOSIS — R5381 Other malaise: Secondary | ICD-10-CM

## 2014-02-03 DIAGNOSIS — R5383 Other fatigue: Secondary | ICD-10-CM

## 2014-02-03 DIAGNOSIS — M949 Disorder of cartilage, unspecified: Secondary | ICD-10-CM | POA: Diagnosis not present

## 2014-02-03 DIAGNOSIS — Z1329 Encounter for screening for other suspected endocrine disorder: Secondary | ICD-10-CM

## 2014-02-03 DIAGNOSIS — Z13228 Encounter for screening for other metabolic disorders: Secondary | ICD-10-CM

## 2014-02-03 DIAGNOSIS — E785 Hyperlipidemia, unspecified: Secondary | ICD-10-CM | POA: Diagnosis not present

## 2014-02-03 DIAGNOSIS — R7989 Other specified abnormal findings of blood chemistry: Secondary | ICD-10-CM

## 2014-02-03 DIAGNOSIS — Z130 Encounter for screening for diseases of the blood and blood-forming organs and certain disorders involving the immune mechanism: Secondary | ICD-10-CM | POA: Diagnosis not present

## 2014-02-03 DIAGNOSIS — M899 Disorder of bone, unspecified: Secondary | ICD-10-CM | POA: Diagnosis not present

## 2014-02-03 DIAGNOSIS — R748 Abnormal levels of other serum enzymes: Secondary | ICD-10-CM

## 2014-02-03 DIAGNOSIS — R945 Abnormal results of liver function studies: Secondary | ICD-10-CM

## 2014-02-03 DIAGNOSIS — Z1321 Encounter for screening for nutritional disorder: Secondary | ICD-10-CM

## 2014-02-03 DIAGNOSIS — Z79899 Other long term (current) drug therapy: Secondary | ICD-10-CM | POA: Diagnosis not present

## 2014-02-03 DIAGNOSIS — I10 Essential (primary) hypertension: Secondary | ICD-10-CM | POA: Diagnosis not present

## 2014-02-03 DIAGNOSIS — F3289 Other specified depressive episodes: Secondary | ICD-10-CM

## 2014-02-03 DIAGNOSIS — Z940 Kidney transplant status: Secondary | ICD-10-CM | POA: Diagnosis not present

## 2014-02-03 DIAGNOSIS — F329 Major depressive disorder, single episode, unspecified: Secondary | ICD-10-CM

## 2014-02-03 LAB — LIPID PANEL
Cholesterol: 265 mg/dL — ABNORMAL HIGH (ref 0–200)
HDL: 44 mg/dL (ref >39)
LDL Cholesterol: 180 mg/dL — ABNORMAL HIGH (ref 0–99)
Total CHOL/HDL Ratio: 6 Ratio
Triglycerides: 204 mg/dL — ABNORMAL HIGH (ref <150)
VLDL: 41 mg/dL — ABNORMAL HIGH (ref 0–40)

## 2014-02-03 LAB — COMPREHENSIVE METABOLIC PANEL
ALT: 48 U/L — ABNORMAL HIGH (ref 0–35)
AST: 38 U/L — ABNORMAL HIGH (ref 0–37)
Albumin: 4.8 g/dL (ref 3.5–5.2)
Alkaline Phosphatase: 40 U/L (ref 39–117)
BUN: 16 mg/dL (ref 6–23)
CO2: 28 mEq/L (ref 19–32)
Calcium: 10.2 mg/dL (ref 8.4–10.5)
Chloride: 102 mEq/L (ref 96–112)
Creat: 1 mg/dL (ref 0.50–1.10)
Glucose, Bld: 100 mg/dL — ABNORMAL HIGH (ref 70–99)
Potassium: 4.2 mEq/L (ref 3.5–5.3)
Sodium: 140 mEq/L (ref 135–145)
Total Bilirubin: 0.3 mg/dL (ref 0.2–1.2)
Total Protein: 7.4 g/dL (ref 6.0–8.3)

## 2014-02-03 LAB — CBC
HCT: 42.5 % (ref 36.0–46.0)
Hemoglobin: 13.8 g/dL (ref 12.0–15.0)
MCH: 27.4 pg (ref 26.0–34.0)
MCHC: 32.5 g/dL (ref 30.0–36.0)
MCV: 84.5 fL (ref 78.0–100.0)
Platelets: 155 10*3/uL (ref 150–400)
RBC: 5.03 MIL/uL (ref 3.87–5.11)
RDW: 15 % (ref 11.5–15.5)
WBC: 5.6 10*3/uL (ref 4.0–10.5)

## 2014-02-03 LAB — TSH: TSH: 1.12 u[IU]/mL (ref 0.350–4.500)

## 2014-02-03 NOTE — Patient Instructions (Signed)
F/u in end April, call if you need me before  Fasting lipid, cmp, TSH and CBC and vit D today

## 2014-02-04 ENCOUNTER — Telehealth: Payer: Self-pay | Admitting: Family Medicine

## 2014-02-04 LAB — VITAMIN D 25 HYDROXY (VIT D DEFICIENCY, FRACTURES): Vit D, 25-Hydroxy: 32 ng/mL (ref 30–89)

## 2014-02-04 NOTE — Telephone Encounter (Signed)
pls sched appt in her name, both will come to the visit

## 2014-02-06 LAB — HEPATITIS PANEL, ACUTE
HCV Ab: NEGATIVE
Hep A IgM: NONREACTIVE
Hep B C IgM: NONREACTIVE
Hepatitis B Surface Ag: NEGATIVE
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
## 2014-02-07 NOTE — Assessment & Plan Note (Signed)
Ongoing stress with marriage  Pt not suicidal or homicidal

## 2014-02-07 NOTE — Assessment & Plan Note (Signed)
Hyperlipidemia:Low fat diet discussed and encouraged.  Uncontrolled

## 2014-02-07 NOTE — Assessment & Plan Note (Signed)
Controlled, no change in medication DASH diet and commitment to daily physical activity for a minimum of 30 minutes discussed and encouraged, as a part of hypertension management. The importance of attaining a healthy weight is also discussed.  

## 2014-02-10 NOTE — Addendum Note (Signed)
Addended by: Eual Fines on: 02/10/2014 03:05 PM   Modules accepted: Orders

## 2014-02-11 ENCOUNTER — Ambulatory Visit: Payer: Medicare Other | Admitting: Family Medicine

## 2014-02-12 ENCOUNTER — Ambulatory Visit (HOSPITAL_COMMUNITY): Payer: Medicare Other

## 2014-03-02 ENCOUNTER — Ambulatory Visit (HOSPITAL_COMMUNITY): Payer: Medicare Other | Attending: Family Medicine

## 2014-03-17 DIAGNOSIS — N182 Chronic kidney disease, stage 2 (mild): Secondary | ICD-10-CM | POA: Diagnosis not present

## 2014-03-22 ENCOUNTER — Encounter: Payer: Self-pay | Admitting: Family Medicine

## 2014-03-22 ENCOUNTER — Ambulatory Visit (INDEPENDENT_AMBULATORY_CARE_PROVIDER_SITE_OTHER): Payer: Medicare Other | Admitting: Family Medicine

## 2014-03-22 ENCOUNTER — Encounter (INDEPENDENT_AMBULATORY_CARE_PROVIDER_SITE_OTHER): Payer: Self-pay

## 2014-03-22 VITALS — BP 124/78 | HR 65 | Resp 16 | Wt 171.0 lb

## 2014-03-22 DIAGNOSIS — Z940 Kidney transplant status: Secondary | ICD-10-CM

## 2014-03-22 DIAGNOSIS — I10 Essential (primary) hypertension: Secondary | ICD-10-CM

## 2014-03-22 DIAGNOSIS — E8881 Metabolic syndrome: Secondary | ICD-10-CM | POA: Diagnosis not present

## 2014-03-22 DIAGNOSIS — F3289 Other specified depressive episodes: Secondary | ICD-10-CM

## 2014-03-22 DIAGNOSIS — E785 Hyperlipidemia, unspecified: Secondary | ICD-10-CM

## 2014-03-22 DIAGNOSIS — F329 Major depressive disorder, single episode, unspecified: Secondary | ICD-10-CM

## 2014-03-22 DIAGNOSIS — R7301 Impaired fasting glucose: Secondary | ICD-10-CM

## 2014-03-22 DIAGNOSIS — R740 Nonspecific elevation of levels of transaminase and lactic acid dehydrogenase [LDH]: Secondary | ICD-10-CM

## 2014-03-22 DIAGNOSIS — G4700 Insomnia, unspecified: Secondary | ICD-10-CM

## 2014-03-22 DIAGNOSIS — R7401 Elevation of levels of liver transaminase levels: Secondary | ICD-10-CM

## 2014-03-22 DIAGNOSIS — R7402 Elevation of levels of lactic acid dehydrogenase (LDH): Secondary | ICD-10-CM

## 2014-03-22 MED ORDER — TEMAZEPAM 7.5 MG PO CAPS: 7.5000 mg | ORAL_CAPSULE | Freq: Every evening | ORAL | Status: DC | PRN

## 2014-03-22 NOTE — Patient Instructions (Addendum)
F/u in 3.5 month, call if you need me  Before  [facility] sleep is restoril , and you will get information on healthy sleep habits  Fasting lipid, cmp, hBA1C in 4 month  Follow low fat diet rich in vegetable fruit, beans and egg white are healthy protein, also white meats bake dor grilled, smaller portions.   Commit to physical activity daily for 30 minutes, weigth loss goal of 4 to 6 pounds   Check at [place], ask dermatologist to see you  You have metabolic syndrome which increases your heart disease risk, so you need to work on weight loss  And lowering blood sugar and fat   Metabolic Syndrome, Adult Metabolic syndrome descibes a group of risk factors for heart disease and diabetes. This syndrome has other names including Insulin Resistance Syndrome. The more risk factors you have, the higher your risk of having a heart attack, stroke, or developing diabetes. These risk factors include:  High blood sugar.  High blood triglyceride (a fat found in the blood) level.  High blood pressure.  Abdominal obesity (your extra weight is around your waist instead of your hips).  Low levels of high-density lipoprotein, HDL (good blood cholesterol). If you have any three of these risk factors, you have metabolic syndrome. If you have even one of these factors, you should make lifestyle changes to improve your health in order to prevent serious health diseases.  In people with metabolic syndrome, the cells do not respond properly to insulin. This can lead to high levels of glucose in the blood, which can interfere with normal body processes. Eventually, this can cause high blood pressure and higher fat levels in the blood, and inflammation of your blood vessels. The result can be heart disease and stroke.  CAUSES   Eating a diet rich in calories and saturated fat.  Too little physical activity.  Being overweight. Other underlying causes are:  Family history (genetics).  Ethnicity  (South Asians are at a higher risk).  Older age (your chances of developing metabolic syndrome are higher as you grow older).  Insulin resistance. SYMPTOMS  By itself, metabolic syndrome has no symptoms. However, you might have symptoms of diabetes (high blood sugar) or high blood pressure, such as:  Increased thirst, urination, and tiredness.  Dizzy spells.  Dull headaches that are unusual for you.  Blurred vision.  Nosebleeds. DIAGNOSIS  Your caregiver may make a diagnosis of metabolic syndrome if you have at least three of these factors:  If you are overweight mostly around the waist. This means a waistline greater than 40" in men and more than 35" in women. The waistline limits are 31 to 35 inches for women and 37 to 39 inches for men. In those who have certain genetic risk factors, such as having a family history of diabetes or being of Asian descent.  If you have a blood pressure of 130/85 mm Hg or more, or if you are being treated for high blood pressure.  If your blood triglyceride level is 150 mg/dL or more, or you are being treated for high levels of triglyceride.  If the level of HDL in your blood is below 40 mg/dL in men, less than 50 mg/dL in women, or you are receiving treatment for low levels of HDL.  If the level of sugar in your blood is high with fasting blood sugar level of 110 mg/dL or more, or you are under treatment for diabetes. TREATMENT  Your caregiver may  have you make lifestyle changes, which may include:  Exercise.  Losing weight.  Maintaining a healthy diet.  Quitting smoking. The lifestyle changes listed above are key in reducing your risk for heart disease and stroke. Medicines may also be prescribed to help your body respond to insulin better and to reduce your blood pressure and blood fat levels. Aspirin may be recommended to reduce risks of heart disease or stroke.  HOME CARE INSTRUCTIONS   Exercise.  Measure your waist at regular  intervals just above the hipbones after you have breathed out.  Maintain a healthy diet.  Eat fruits, such as apples, oranges, and pears.  Eat vegetables.  Eat legumes, such as kidney beans, peas, and lentils.  Eat food rich in soluble fiber, such as whole grain cereal, oatmeal, and oat bran.  Use olive or safflower oils and avoid saturated fats.  Eat nuts.  Limit the amount of salt you eat or add to food.  Limit the amount of alcohol you drink.  Include fish in your diet, if possible.  Stop smoking if you are a smoker.  Maintain regular follow-up appointments.  Follow your caregiver's advice. SEEK MEDICAL CARE IF:   You feel very tired or fatigued.  You develop excessive thirst.  You pass large quantities of urine.  You are putting on weight around your waist rather than losing weight.  You develop headaches over and over again.  You have off-and-on dizzy spells. SEEK IMMEDIATE MEDICAL CARE IF:   You develop nosebleeds.  You develop sudden blurred vision.  You develop sudden dizzy spells.  You develop chest pains, trouble breathing, or feel an abnormal or irregular heart beat.  You have a fainting episode.  You develop any sudden trouble speaking and/or swallowing.  You develop sudden weakness in one arm and/or one leg. MAKE SURE YOU:   Understand these instructions.  Will watch your condition.  Will get help right away if you are not doing well or get worse. Document Released: 02/19/2008 Document Revised: 02/04/2012 Document Reviewed: 02/19/2008 [facility] Patient Information 2014 [place], [place].

## 2014-04-18 ENCOUNTER — Telehealth: Payer: Self-pay | Admitting: Family Medicine

## 2014-04-18 DIAGNOSIS — R740 Nonspecific elevation of levels of transaminase and lactic acid dehydrogenase [LDH]: Secondary | ICD-10-CM

## 2014-04-18 DIAGNOSIS — R7401 Elevation of levels of liver transaminase levels: Secondary | ICD-10-CM | POA: Insufficient documentation

## 2014-04-18 NOTE — Assessment & Plan Note (Signed)
Sleep hygiene reviewed and written information offered also. Prescription sent for  medication needed.  

## 2014-04-18 NOTE — Assessment & Plan Note (Signed)
Followed at [facility] and in [place], stable on immunosuppressive meds, nor,mal creatinine

## 2014-04-18 NOTE — Assessment & Plan Note (Signed)
The increased risk of cardiovascular disease associated with this diagnosis, and the need to consistently work on lifestyle to change this is discussed. Following  a  heart healthy diet ,commitment to 30 minutes of exercise at least 5 days per week, as well as control of blood sugar and cholesterol , and achieving a healthy weight are all the areas to be addressed .  

## 2014-04-18 NOTE — Assessment & Plan Note (Signed)
Deteriorated, elevated LFT, will hold on statin at this time Needs to work on low fat diet, rept hepatic in several weeka
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
## 2014-04-18 NOTE — Assessment & Plan Note (Signed)
Needs rept testing end May, ;liklely problem is fatty liver. Pt contacted after visit when lbs were available, she is to also have [place] RUQ Need to lose weight discussed and lower fat intake

## 2014-04-18 NOTE — Assessment & Plan Note (Signed)
Controlled, no change in medication DASH diet and commitment to daily physical activity for a minimum of 30 minutes discussed and encouraged, as a part of hypertension management. The importance of attaining a healthy weight is also discussed.  

## 2014-04-18 NOTE — Telephone Encounter (Signed)
Pls contact pt, she had [place] of liver ordered due to abn LFT, still has not had this, a;lso she should be having rept LFT this week, her lipids were very high and the hope is that she will be able to start meds at low dose. Pls remind her of need for both , not sure if she opted t o have them through the [place], pls let me know

## 2014-04-18 NOTE — Assessment & Plan Note (Signed)
Primarily due to marital stress, not suicidal or homicidal, receives therapy through the [place] Not suicidal or homicidal

## 2014-04-20 NOTE — Telephone Encounter (Signed)
[facility] and will call back tomorrow to let us know what she decides

## 2014-04-21 ENCOUNTER — Telehealth: Payer: Self-pay | Admitting: Family Medicine

## 2014-04-22 NOTE — Telephone Encounter (Signed)
[facility]. Will [facility] to number patient provided

## 2014-04-22 NOTE — Telephone Encounter (Signed)
Spoke with patient. See previous message.  

## 2014-07-07 ENCOUNTER — Ambulatory Visit: Payer: Medicare Other | Admitting: Family Medicine

## 2014-07-09 DIAGNOSIS — B259 Cytomegaloviral disease, unspecified: Secondary | ICD-10-CM | POA: Diagnosis not present

## 2014-07-09 DIAGNOSIS — E119 Type 2 diabetes mellitus without complications: Secondary | ICD-10-CM | POA: Diagnosis not present

## 2014-07-09 DIAGNOSIS — I10 Essential (primary) hypertension: Secondary | ICD-10-CM | POA: Diagnosis not present

## 2014-07-19 ENCOUNTER — Encounter (INDEPENDENT_AMBULATORY_CARE_PROVIDER_SITE_OTHER): Payer: Self-pay

## 2014-07-19 ENCOUNTER — Ambulatory Visit (INDEPENDENT_AMBULATORY_CARE_PROVIDER_SITE_OTHER): Payer: Medicare Other | Admitting: Family Medicine

## 2014-07-19 ENCOUNTER — Encounter: Payer: Self-pay | Admitting: Family Medicine

## 2014-07-19 VITALS — BP 120/72 | HR 68 | Resp 16 | Ht 64.0 in | Wt 169.0 lb

## 2014-07-19 DIAGNOSIS — N058 Unspecified nephritic syndrome with other morphologic changes: Secondary | ICD-10-CM | POA: Diagnosis not present

## 2014-07-19 DIAGNOSIS — E785 Hyperlipidemia, unspecified: Secondary | ICD-10-CM | POA: Diagnosis not present

## 2014-07-19 DIAGNOSIS — R5381 Other malaise: Secondary | ICD-10-CM

## 2014-07-19 DIAGNOSIS — R5383 Other fatigue: Secondary | ICD-10-CM

## 2014-07-19 DIAGNOSIS — F329 Major depressive disorder, single episode, unspecified: Secondary | ICD-10-CM

## 2014-07-19 DIAGNOSIS — I10 Essential (primary) hypertension: Secondary | ICD-10-CM

## 2014-07-19 DIAGNOSIS — E1129 Type 2 diabetes mellitus with other diabetic kidney complication: Secondary | ICD-10-CM

## 2014-07-19 DIAGNOSIS — E1121 Type 2 diabetes mellitus with diabetic nephropathy: Secondary | ICD-10-CM

## 2014-07-19 DIAGNOSIS — Z23 Encounter for immunization: Secondary | ICD-10-CM | POA: Insufficient documentation

## 2014-07-19 DIAGNOSIS — E8881 Metabolic syndrome: Secondary | ICD-10-CM

## 2014-07-19 DIAGNOSIS — E1165 Type 2 diabetes mellitus with hyperglycemia: Secondary | ICD-10-CM | POA: Insufficient documentation

## 2014-07-19 DIAGNOSIS — F3289 Other specified depressive episodes: Secondary | ICD-10-CM

## 2014-07-19 DIAGNOSIS — IMO0002 Reserved for concepts with insufficient information to code with codable children: Secondary | ICD-10-CM | POA: Insufficient documentation

## 2014-07-19 LAB — HEMOGLOBIN A1C
Hemoglobin-A1c: 7.2
LDL Cholesterol: 157 mg/dL

## 2014-07-19 MED ORDER — LISINOPRIL 10 MG PO TABS: 10.0000 mg | ORAL_TABLET | Freq: Every day | ORAL | Status: DC

## 2014-07-19 NOTE — Assessment & Plan Note (Addendum)
Add ace for renal protection Controlled, no change in medication DASH diet and commitment to daily physical activity for a minimum of 30 minutes discussed and encouraged, as a part of hypertension management. The importance of attaining a healthy weight is also discussed.

## 2014-07-19 NOTE — Patient Instructions (Addendum)
F/u early Nov , call if you need me before  Pls call back with dose and name of your med for diabetes  Plan to attend class  Take cholesterol medication every evening as prescribed  Another medication to help with kidneys is recommended now that you are diabetic, I will get the OK from your kidney Doc before sending this in, (lisnopril)   Fasting lipid, cmp and EGFr, and HBA1C in Nov before next appt pls  Foot exam today is good and remember to check your feet every day

## 2014-07-19 NOTE — Assessment & Plan Note (Addendum)
Hyperlipidemia:Low fat diet discussed and encouraged.  Updated lab needed at/ before next visit. Uncontrolled Med compliance stressed and encouraged

## 2014-07-20 DIAGNOSIS — E1129 Type 2 diabetes mellitus with other diabetic kidney complication: Secondary | ICD-10-CM | POA: Diagnosis not present

## 2014-07-20 DIAGNOSIS — N058 Unspecified nephritic syndrome with other morphologic changes: Secondary | ICD-10-CM | POA: Diagnosis not present

## 2014-07-20 LAB — MICROALBUMIN / CREATININE URINE RATIO
Creatinine, Urine: 223.4 mg/dL
Microalb Creat Ratio: 4.2 mg/g (ref 0.0–30.0)
Microalb, Ur: 0.94 mg/dL (ref 0.00–1.89)

## 2014-08-03 NOTE — Assessment & Plan Note (Addendum)
New diabetic Patient advised to reduce carb and sweets, commit to regular physical activity, take meds as prescribed, test blood as directed, and attempt to lose weight, to improve blood sugar control. Teaching done by myself as well as  By nurse and she is to attend diabetic class Name needed of the med she regularly is prescribed from the [place] Needs ACE , however I have askled her nephrologist to let me know if appropriate , she has a transplanted kidney

## 2014-08-03 NOTE — Assessment & Plan Note (Signed)
The increased risk of cardiovascular disease associated with this diagnosis, and the need to consistently work on lifestyle to change this is discussed. Following  a  heart healthy diet ,commitment to 30 minutes of exercise at least 5 days per week, as well as control of blood sugar and cholesterol , and achieving a healthy weight are all the areas to be addressed .  

## 2014-08-03 NOTE — Assessment & Plan Note (Signed)
Vaccine administered at visit.  

## 2014-08-03 NOTE — Progress Notes (Signed)
Subjective:    Patient ID: Michaela [name], female    DOB: [birth year], 58 y.o.   MRN: [id number]  HPI The PT is here for follow up and re-evaluation of chronic medical conditions, medication management and review of any available recent lab and radiology data. Most importantly, since her last visit , she has been diagnosed as a diabetic , and is just starting to understand and learn about the disease. Preventive health is updated, specifically  Cancer screening and Immunization.   Has upcoming renal follow up The PT denies any adverse reactions to current medications since the last visit. Did not fill anmti depressant med recommended, did not feel she needed it, sees a therapist at the [facility] See HPI Denies recent fever or chills. Denies sinus pressure, nasal congestion, ear pain or sore throat. Denies chest congestion, productive cough or wheezing. Denies chest pains, palpitations and leg swelling Denies abdominal pain, nausea, vomiting,diarrhea or constipation.   Denies dysuria, frequency, hesitancy or incontinence. Denies joint pain, swelling and limitation in mobility. Denies headaches, seizures, numbness, or tingling. Denies depression, anxiety or insomnia. Denies skin break down or rash.        Objective:   Physical Exam BP 120/72  Pulse 68  Resp 16  Ht 5\' 4"  (1.626 m)  Wt 169 lb (76.658 kg)  BMI 28.99 kg/m2  SpO2 100% Patient alert and oriented and in no cardiopulmonary distress.  HEENT: No facial asymmetry, EOMI,   oropharynx pink and moist.  Neck supple no JVD, no mass.  Chest: Clear to auscultation bilaterally.  CVS: S1, S2 no murmurs, no S3.Regular rate.  ABD: Soft non tender.   Ext: No edema  MS: Adequate ROM spine, shoulders, hips and knees.  Skin: Intact, no ulcerations or rash noted.  Psych: Good eye contact, normal affect. Memory intact not anxious or depressed appearing.  CNS: CN 2-12 intact, power,  normal throughout.no  focal deficits noted.        Assessment & Plan:  HYPERTENSION Add ace for renal protection Controlled, no change in medication DASH diet and commitment to daily physical activity for a minimum of 30 minutes discussed and encouraged, as a part of hypertension management. The importance of attaining a healthy weight is also discussed.   HYPERLIPIDEMIA Hyperlipidemia:Low fat diet discussed and encouraged.  Updated lab needed at/ before next visit. 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 Teaching done by myself as well as  By nurse and she is to attend diabetic class Name needed of the med she regularly is prescribed from the [place] Needs ACE , however I have askled her nephrologist to let me know if appropriate , she has a transplanted kidney  Need for vaccination with 13-polyvalent pneumococcal conjugate vaccine Vaccine administered at visit.   DEPRESSION Improved, sees counselor and has excellent support from her family, did not take medication prescribed, no longer feels the need for med at this time  Metabolic syndrome X The increased risk of cardiovascular disease associated with this diagnosis, and the need to consistently work on lifestyle to change this is discussed. Following  a  heart healthy diet ,commitment to 30 minutes of exercise at least 5 days per week, as well as control of blood  sugar and cholesterol , and achieving a healthy weight are all the areas to be addressed .

## 2014-08-03 NOTE — Assessment & Plan Note (Signed)
Improved, sees counselor and has excellent support from her family, did not take medication prescribed, no longer feels the need for med at this time

## 2014-08-28 ENCOUNTER — Telehealth: Payer: Self-pay | Admitting: Family Medicine

## 2014-08-28 NOTE — Telephone Encounter (Signed)
Pls contact pt, let her know I spoke with Dr Florene Glen, her nephrologist. It is safe and recommended that she take the lisinoproil as a diabetic for kidney protection.I spoke with him on 08/27/2014. Pls print and send the script for lisinoproil entered to the pharmacy of her choice, thanks  ??pls ask

## 2014-08-30 ENCOUNTER — Other Ambulatory Visit: Payer: Self-pay

## 2014-08-30 DIAGNOSIS — I10 Essential (primary) hypertension: Secondary | ICD-10-CM

## 2014-08-30 MED ORDER — LISINOPRIL 10 MG PO TABS: 10.0000 mg | ORAL_TABLET | Freq: Every day | ORAL | Status: DC

## 2014-08-30 NOTE — Telephone Encounter (Signed)
Patient is aware.  She is requesting that med be sent to the [place] and will call back with that fax #.

## 2014-09-27 DIAGNOSIS — E119 Type 2 diabetes mellitus without complications: Secondary | ICD-10-CM | POA: Diagnosis not present

## 2014-09-27 DIAGNOSIS — B259 Cytomegaloviral disease, unspecified: Secondary | ICD-10-CM | POA: Diagnosis not present

## 2014-09-27 DIAGNOSIS — I10 Essential (primary) hypertension: Secondary | ICD-10-CM | POA: Diagnosis not present

## 2014-09-28 DIAGNOSIS — E785 Hyperlipidemia, unspecified: Secondary | ICD-10-CM | POA: Diagnosis not present

## 2014-09-28 DIAGNOSIS — E139 Other specified diabetes mellitus without complications: Secondary | ICD-10-CM | POA: Diagnosis not present

## 2014-09-28 DIAGNOSIS — Z79899 Other long term (current) drug therapy: Secondary | ICD-10-CM | POA: Diagnosis not present

## 2014-09-28 DIAGNOSIS — Z4822 Encounter for aftercare following kidney transplant: Secondary | ICD-10-CM | POA: Diagnosis not present

## 2014-09-28 DIAGNOSIS — D899 Disorder involving the immune mechanism, unspecified: Secondary | ICD-10-CM | POA: Diagnosis not present

## 2014-09-28 DIAGNOSIS — Z7982 Long term (current) use of aspirin: Secondary | ICD-10-CM | POA: Diagnosis not present

## 2014-09-28 DIAGNOSIS — Z7952 Long term (current) use of systemic steroids: Secondary | ICD-10-CM | POA: Diagnosis not present

## 2014-09-28 DIAGNOSIS — Z940 Kidney transplant status: Secondary | ICD-10-CM | POA: Diagnosis not present

## 2014-10-07 ENCOUNTER — Ambulatory Visit: Payer: Medicare Other | Admitting: Family Medicine

## 2014-11-08 ENCOUNTER — Encounter (INDEPENDENT_AMBULATORY_CARE_PROVIDER_SITE_OTHER): Payer: Self-pay

## 2014-11-08 ENCOUNTER — Ambulatory Visit (INDEPENDENT_AMBULATORY_CARE_PROVIDER_SITE_OTHER): Payer: Medicare Other | Admitting: Family Medicine

## 2014-11-08 ENCOUNTER — Encounter: Payer: Self-pay | Admitting: Family Medicine

## 2014-11-08 VITALS — BP 126/78 | HR 64 | Resp 18 | Ht 64.0 in | Wt 172.0 lb

## 2014-11-08 DIAGNOSIS — E1121 Type 2 diabetes mellitus with diabetic nephropathy: Secondary | ICD-10-CM | POA: Diagnosis not present

## 2014-11-08 DIAGNOSIS — B369 Superficial mycosis, unspecified: Secondary | ICD-10-CM

## 2014-11-08 DIAGNOSIS — B372 Candidiasis of skin and nail: Secondary | ICD-10-CM

## 2014-11-08 DIAGNOSIS — I10 Essential (primary) hypertension: Secondary | ICD-10-CM | POA: Diagnosis not present

## 2014-11-08 DIAGNOSIS — E785 Hyperlipidemia, unspecified: Secondary | ICD-10-CM | POA: Diagnosis not present

## 2014-11-08 DIAGNOSIS — L738 Other specified follicular disorders: Secondary | ICD-10-CM

## 2014-11-08 MED ORDER — TERBINAFINE HCL 250 MG PO TABS: 250.0000 mg | ORAL_TABLET | Freq: Every day | ORAL | Status: DC

## 2014-11-08 MED ORDER — FLUCONAZOLE 150 MG PO TABS: 150.0000 mg | ORAL_TABLET | Freq: Once | ORAL | Status: DC

## 2014-11-08 MED ORDER — LEVOFLOXACIN 500 MG PO TABS: 500.0000 mg | ORAL_TABLET | Freq: Every day | ORAL | Status: DC

## 2014-11-08 NOTE — Patient Instructions (Addendum)
F/u in 4 month, call if you need me before  Please schedule eye exam at the [place]  [facility]  Get date of next class here for diabetes before you leave from nurse  Pls stop using the rings that you are wearing , seem to have allergic reaction , and also fungal infection, fluconazole is prescribed and antifungal tablet  For skin infection (in genital area) antibiotic is prescribed for 5 days

## 2015-02-09 DIAGNOSIS — I10 Essential (primary) hypertension: Secondary | ICD-10-CM | POA: Diagnosis not present

## 2015-02-09 DIAGNOSIS — B259 Cytomegaloviral disease, unspecified: Secondary | ICD-10-CM | POA: Diagnosis not present

## 2015-02-09 DIAGNOSIS — Z940 Kidney transplant status: Secondary | ICD-10-CM | POA: Diagnosis not present

## 2015-02-09 DIAGNOSIS — E119 Type 2 diabetes mellitus without complications: Secondary | ICD-10-CM | POA: Diagnosis not present

## 2015-03-10 ENCOUNTER — Ambulatory Visit: Payer: Medicare Other | Admitting: Family Medicine

## 2015-03-11 ENCOUNTER — Telehealth: Payer: Self-pay

## 2015-03-13 NOTE — Telephone Encounter (Signed)
This has already taken place, she already had her transplant, and her kidney function is now normal Also she missed her recent appt. Pls find out if hse has  Medicare and when she got it, last seen in 10/2014, now I see after missesd appt [facility] in 07/2015, not sure what's  Going on pls let me know

## 2015-03-14 NOTE — Telephone Encounter (Signed)
Patient states that the letter is just to justify for services at the [place].  She is coming in in sept with husband for followup,

## 2015-03-14 NOTE — Telephone Encounter (Signed)
Since her labs here are very abnormal pls ask her to have her most recent fastiing lipid, cmop and EGFR and HBA1C sent from the [place] where she is going Ipresume

## 2015-03-14 NOTE — Telephone Encounter (Signed)
Spoke with patient and she is going to the [facility] 4/19.  She will have most recent labs sent as well as recent imaging.

## 2015-03-27 DIAGNOSIS — L738 Other specified follicular disorders: Secondary | ICD-10-CM | POA: Insufficient documentation

## 2015-03-27 DIAGNOSIS — B369 Superficial mycosis, unspecified: Secondary | ICD-10-CM | POA: Insufficient documentation

## 2015-03-27 NOTE — Assessment & Plan Note (Signed)
Antibiotic prescribed   for 5 days

## 2015-03-27 NOTE — Assessment & Plan Note (Signed)
Uncontrolled Hyperlipidemia:Low fat diet discussed and encouraged.   Lipid Panel  Lab Results  Component Value Date   CHOL 265* 02/03/2014   HDL 44 02/03/2014   LDLCALC 157 06/10/2014   TRIG 204* 02/03/2014   CHOLHDL 6.0 02/03/2014      [facility]

## 2015-03-27 NOTE — Progress Notes (Signed)
Subjective:    Patient ID: Michaela [name], female    DOB: 1956/09/15, 59 y.o.   MRN: [id number]  HPI The PT is here for follow up and re-evaluation of chronic medical conditions, medication management and review of any available recent lab and radiology data.  Preventive health is updated, specifically  Cancer screening and Immunization.   [facility] states [facility] has had recent labs and intends to get them here Denies polyuria, polydipsia, blurred vision , or hypoglycemic episodes.  The PT denies any adverse reactions to current medications since the last visit.  C/o boils in genital area with drainage for past 3 days, no fever or chills. C/o rash on finger with swelling around nail bed, no trauma to digit       Review of Systems See HPI Denies recent fever or chills. Denies sinus pressure, nasal congestion, ear pain or sore throat. Denies chest congestion, productive cough or wheezing. Denies chest pains, palpitations and leg swelling Denies abdominal pain, nausea, vomiting,diarrhea or constipation.   Denies dysuria, frequency, hesitancy or incontinence. Denies joint pain, swelling and limitation in mobility. Denies headaches, seizures, numbness, or tingling. Denies depression, anxiety or insomnia.         Objective:   Physical Exam BP 126/78 mmHg  Pulse 64  Resp 18  Ht 5\' 4"  (1.626 m)  Wt 172 lb 0.6 oz (78.037 kg)  BMI 29.52 kg/m2  SpO2 98% Patient alert and oriented and in no cardiopulmonary distress.  HEENT: No facial asymmetry, EOMI,   oropharynx pink and moist.  Neck supple no JVD, no mass.  Chest: Clear to auscultation bilaterally.  CVS: S1, S2 no murmurs, no S3.Regular rate.  ABD: Soft non tender.   Ext: No edema  MS: Adequate ROM spine, shoulders, hips and knees.  Skin:foliculitis of skin in genital area, nad fungal infectipon on digit as well as allergic reaction to ring  Psych: Good eye contact, normal affect. Memory intact not  anxious or depressed appearing.  CNS: CN 2-12 intact, power,  normal throughout.no focal deficits noted.        Assessment & Plan:  Essential hypertension Controlled, no change in medication DASH diet and commitment to daily physical activity for a minimum of 30 minutes discussed and encouraged, as a part of hypertension management. The importance of attaining a healthy weight is also discussed.  BP/Weight 11/08/2014 07/19/2014 03/22/2014 02/03/2014 10/06/2013 05/06/2013 03/00/9233  Systolic BP 007 622 633 354 562 563 893  Diastolic BP 78 72 78 80 70 78 80  Wt. (Lbs) 172.04 169 171 168.8 164.4 161.4 152.08  BMI 29.52 28.99 29.34 28.96 28.21 27.69 26.09         Type 2 diabetes with nephropathy Uncontrolled, updated lab from [facility] past due, pt to provide same Patient advised to reduce carb and sweets, commit to regular physical activity, take meds as prescribed, test blood as directed, and attempt to lose weight, to improve blood sugar control.    Candidiasis of skin Fluconazole tab  Prescribed for limited time to address this , pt to with hold statin on days taking the fluconazole, she is aware   Hyperlipidemia LDL goal <100 Uncontrolled Hyperlipidemia:Low fat diet discussed and encouraged.   Lipid Panel  Lab Results  Component Value Date   CHOL 265* 02/03/2014   HDL 44 02/03/2014   LDLCALC 157 06/10/2014   TRIG 204* 02/03/2014   CHOLHDL 6.0 02/03/2014      Updated [facility] needed [facility] them [facility] here    Bacterial folliculitis Antibiotic prescribed   for 5 days   Fungal infection of skin Fungal infection around finger   2week course of terbinafine tab, and pt advised to keep hands dry

## 2015-03-27 NOTE — Assessment & Plan Note (Signed)
Fungal infection around finger   2week course of terbinafine tab, and pt advised to keep hands dry

## 2015-03-27 NOTE — Assessment & Plan Note (Signed)
Controlled, no change in medication DASH diet and commitment to daily physical activity for a minimum of 30 minutes discussed and encouraged, as a part of hypertension management. The importance of attaining a healthy weight is also discussed.  BP/Weight 11/08/2014 07/19/2014 03/22/2014 02/03/2014 10/06/2013 05/06/2013 37/62/8315  Systolic BP 176 160 737 106 269 485 462  Diastolic BP 78 72 78 80 70 78 80  Wt. (Lbs) 172.04 169 171 168.8 164.4 161.4 152.08  BMI 29.52 28.99 29.34 28.96 28.21 27.69 26.09

## 2015-03-27 NOTE — Assessment & Plan Note (Signed)
Uncontrolled, updated lab from [facility] past due, pt to provide same Patient advised to reduce carb and sweets, commit to regular physical activity, take meds as prescribed, test blood as directed, and attempt to lose weight, to improve blood sugar control.

## 2015-03-27 NOTE — Assessment & Plan Note (Signed)
Fluconazole tab  Prescribed for limited time to address this , pt to with hold statin on days taking the fluconazole, she is aware

## 2015-04-18 ENCOUNTER — Telehealth: Payer: Self-pay

## 2015-04-18 MED ORDER — ONDANSETRON HCL 4 MG PO TABS: 4.0000 mg | ORAL_TABLET | Freq: Every day | ORAL | Status: DC | PRN

## 2015-04-18 MED ORDER — DIPHENOXYLATE-ATROPINE 2.5-0.025 MG PO TABS: 1.0000 | ORAL_TABLET | Freq: Four times a day (QID) | ORAL | Status: DC | PRN

## 2015-04-18 NOTE — Telephone Encounter (Signed)
Standing orders initiated for Zofran and Lomotil.  Patient aware to increase fluids, handwashing, and brat diet.  Will call back if she continues to have problems.

## 2015-04-19 ENCOUNTER — Ambulatory Visit: Payer: Managed Care, Other (non HMO) | Admitting: Family Medicine

## 2015-04-19 ENCOUNTER — Encounter: Payer: Self-pay | Admitting: *Deleted

## 2015-06-08 DIAGNOSIS — R748 Abnormal levels of other serum enzymes: Secondary | ICD-10-CM | POA: Diagnosis not present

## 2015-06-08 DIAGNOSIS — Z940 Kidney transplant status: Secondary | ICD-10-CM | POA: Diagnosis not present

## 2015-06-08 DIAGNOSIS — B259 Cytomegaloviral disease, unspecified: Secondary | ICD-10-CM | POA: Diagnosis not present

## 2015-06-08 DIAGNOSIS — I10 Essential (primary) hypertension: Secondary | ICD-10-CM | POA: Diagnosis not present

## 2015-06-08 DIAGNOSIS — E119 Type 2 diabetes mellitus without complications: Secondary | ICD-10-CM | POA: Diagnosis not present

## 2015-07-27 ENCOUNTER — Other Ambulatory Visit: Payer: Self-pay | Admitting: Family Medicine

## 2015-07-27 DIAGNOSIS — Z1231 Encounter for screening mammogram for malignant neoplasm of breast: Secondary | ICD-10-CM

## 2015-08-17 ENCOUNTER — Ambulatory Visit (INDEPENDENT_AMBULATORY_CARE_PROVIDER_SITE_OTHER): Payer: Medicare Other | Admitting: Family Medicine

## 2015-08-17 ENCOUNTER — Encounter: Payer: Self-pay | Admitting: Family Medicine

## 2015-08-17 VITALS — BP 130/80 | HR 60 | Resp 16 | Ht 64.0 in | Wt 167.8 lb

## 2015-08-17 DIAGNOSIS — Z0000 Encounter for general adult medical examination without abnormal findings: Secondary | ICD-10-CM

## 2015-08-17 NOTE — Progress Notes (Signed)
Preventive Screening-Counseling & Management   Patient present here today for a Medicare annual wellness visit.   Current Problems (verified)   Medications Prior to Visit Allergies (verified)   PAST HISTORY  Family History (verified)   Social History Married since 1983, 2 boys and never a smoker or used alcohol    Risk Factors  Current exercise habits:  None currently, will start walking a few days week   Dietary issues discussed: Heart healthy, low carb, low fat    Cardiac risk factors: dm Type 2   Depression Screen  (Note: if answer to either of the following is "Yes", a more complete depression screening is indicated)   Over the past two weeks, have you felt down, depressed or hopeless? No  Over the past two weeks, have you felt little interest or pleasure in doing things? No  Have you lost interest or pleasure in daily life? No  Do you often feel hopeless? No  Do you cry easily over simple problems? No   Activities of Daily Living  In your present state of health, do you have any difficulty performing the following activities?  Driving?: No Managing money?: No Feeding yourself?:No Getting from bed to chair?:No Climbing a flight of stairs?:No Preparing food and eating?:No Bathing or showering?:No Getting dressed?:No Getting to the toilet?:No Using the toilet?:No Moving around from place to place?: No  Fall Risk Assessment In the past year have you fallen or had a near fall?: fell at the venue on slippery floors Are you currently taking any medications that make you dizzy?:No   Hearing Difficulties: No Do you often ask people to speak up or repeat themselves?:No Do you experience ringing or noises in your ears?:No Do you have difficulty understanding soft or whispered voices?:No  Cognitive Testing  Alert? Yes Normal Appearance?Yes  Oriented to person? Yes Place? Yes  Time? Yes  Displays appropriate judgment?Yes  Can read the correct time from a watch  face? yes Are you having problems remembering things?No  Advanced Directives have been discussed with the patient?Yes, brochure given  , full code   List the Names of Other Physician/Practitioners you currently use:  [facility]) Dr Lucky Cowboy (nephrologist)  Dr Erling Cruz [phone] kidney)  Dr Stann Mainland (transplant surgeon [phone])   Indicate any recent Medical Services you may have received from other than [facility] providers in the past year (date may be approximate).   Assessment:   BP 130/80 mmHg  Pulse 60  Resp 16  Ht 5\' 4"  (1.626 m)  Wt 167 lb 12.8 oz (76.114 kg)  BMI 28.79 kg/m2  SpO2 96%  Annual Wellness Exam   Plan:     Medicare Attestation    I have personally reviewed the patient's medical and social history. Use of alcohol, tobacco or illicit drugs, current medications and supple,ments Patient's functional ability, ADL's , fall risk, home safety risks. Cognitive, hearing and vision screening are done and recorded.Diet and physical activity are reviewed. Evidence for depression or or mood disorder is assessed Weight ,height , BMI and visual acuity are recorded. I have made referrals, counseling and provided education to patient based on record above, and I have provided patient with a written, personalized care plan for preventive services   Medicare annual wellness visit, subsequent Annual exam as documented. Counseling done  re healthy lifestyle involving commitment to 150 minutes exercise per week, heart healthy diet, and attaining healthy weight.The importance of adequate sleep also discussed. Regular seat belt use and home safety,  is also discussed. Changes in health habits are decided on by the patient with goals and time frames  set for achieving them. Immunization and cancer screening needs are specifically addressed at this visit.

## 2015-08-17 NOTE — Patient Instructions (Addendum)
F/u in  6 months, call if you need me before  You need shingles vaccine  You need flu vaccine, unsure of   You need pap  Get mammogrqm as scheduled please   Have recent labs sent from [place] [facility] and silver sneakers   Continue therapy  Continue faith  Thanks for choosing [facility], we consider it a privelige to serve you.   Please work on good  health habits so that your health will improve. 1. Commitment to daily physical activity for 30 to 60  minutes, if you are able to do this.  2. Commitment to wise food choices. Aim for half of your  food intake to be vegetable and fruit, one quarter starchy foods, and one quarter protein. Try to eat on a regular schedule  3 meals per day, snacking between meals should be limited to vegetables or fruits or small portions of nuts. 64 ounces of water per day is generally recommended, unless you have specific health conditions, like heart failure or kidney failure where you will need to limit fluid intake.  3. Commitment to sufficient and a  good quality of physical and mental rest daily, generally between 6 to 8 hours per day.  WITH PERSISTANCE AND PERSEVERANCE, THE IMPOSSIBLE , BECOMES THE NORM!

## 2015-08-17 NOTE — Assessment & Plan Note (Signed)

## 2015-08-22 ENCOUNTER — Ambulatory Visit
Admission: RE | Admit: 2015-08-22 | Discharge: 2015-08-22 | Disposition: A | Discharge status: Home / Self Care | Payer: Medicare Other | Source: Ambulatory Visit | Attending: Family Medicine | Admitting: Family Medicine

## 2015-08-22 ENCOUNTER — Ambulatory Visit (HOSPITAL_COMMUNITY): Payer: Medicare Other

## 2015-08-22 DIAGNOSIS — Z1231 Encounter for screening mammogram for malignant neoplasm of breast: Secondary | ICD-10-CM | POA: Diagnosis not present

## 2015-09-29 DIAGNOSIS — E1122 Type 2 diabetes mellitus with diabetic chronic kidney disease: Secondary | ICD-10-CM | POA: Diagnosis not present

## 2015-09-29 DIAGNOSIS — Z9851 Tubal ligation status: Secondary | ICD-10-CM | POA: Diagnosis not present

## 2015-09-29 DIAGNOSIS — Z940 Kidney transplant status: Secondary | ICD-10-CM | POA: Diagnosis not present

## 2015-09-29 DIAGNOSIS — N186 End stage renal disease: Secondary | ICD-10-CM | POA: Diagnosis not present

## 2015-09-29 DIAGNOSIS — Z7982 Long term (current) use of aspirin: Secondary | ICD-10-CM | POA: Diagnosis not present

## 2015-09-29 DIAGNOSIS — E785 Hyperlipidemia, unspecified: Secondary | ICD-10-CM | POA: Diagnosis not present

## 2015-09-29 DIAGNOSIS — I120 Hypertensive chronic kidney disease with stage 5 chronic kidney disease or end stage renal disease: Secondary | ICD-10-CM | POA: Diagnosis not present

## 2015-09-29 DIAGNOSIS — Z7952 Long term (current) use of systemic steroids: Secondary | ICD-10-CM | POA: Diagnosis not present

## 2015-09-29 DIAGNOSIS — Z79899 Other long term (current) drug therapy: Secondary | ICD-10-CM | POA: Diagnosis not present

## 2015-09-29 DIAGNOSIS — Z9889 Other specified postprocedural states: Secondary | ICD-10-CM | POA: Diagnosis not present

## 2015-09-29 DIAGNOSIS — Z4822 Encounter for aftercare following kidney transplant: Secondary | ICD-10-CM | POA: Diagnosis not present

## 2015-10-07 LAB — HEMOGLOBIN A1C: A1c: 7.2

## 2015-10-14 ENCOUNTER — Encounter: Payer: Self-pay | Admitting: Nephrology

## 2015-10-14 DIAGNOSIS — R7989 Other specified abnormal findings of blood chemistry: Secondary | ICD-10-CM | POA: Diagnosis not present

## 2015-10-14 DIAGNOSIS — E119 Type 2 diabetes mellitus without complications: Secondary | ICD-10-CM | POA: Diagnosis not present

## 2015-10-14 DIAGNOSIS — B349 Viral infection, unspecified: Secondary | ICD-10-CM | POA: Diagnosis not present

## 2015-10-14 DIAGNOSIS — B259 Cytomegaloviral disease, unspecified: Secondary | ICD-10-CM | POA: Diagnosis not present

## 2015-10-14 DIAGNOSIS — I10 Essential (primary) hypertension: Secondary | ICD-10-CM | POA: Diagnosis not present

## 2015-11-09 ENCOUNTER — Telehealth: Payer: Self-pay | Admitting: Family Medicine

## 2015-11-10 NOTE — Telephone Encounter (Signed)
Spoke with patient and she was calling to report on husbands recent [place] visit.  Cardio was unable to get his heart rhythm back to normal.  He will have sleep study done.  She is going to fax over his recent labs.

## 2016-01-26 ENCOUNTER — Telehealth: Payer: Self-pay | Admitting: Family Medicine

## 2016-01-26 NOTE — Telephone Encounter (Signed)
Patient is asking for the nurse she has some information she needs to pass along to you and she has questions about paperwork from the [place]

## 2016-02-07 NOTE — Telephone Encounter (Signed)
Called and left message for wife confirming that labs were received on Michaela [name] from the [place].

## 2016-02-09 ENCOUNTER — Ambulatory Visit: Payer: Medicare Other | Admitting: Family Medicine

## 2016-02-14 DIAGNOSIS — N182 Chronic kidney disease, stage 2 (mild): Secondary | ICD-10-CM | POA: Diagnosis not present

## 2016-02-14 DIAGNOSIS — I10 Essential (primary) hypertension: Secondary | ICD-10-CM | POA: Diagnosis not present

## 2016-02-14 DIAGNOSIS — B349 Viral infection, unspecified: Secondary | ICD-10-CM | POA: Diagnosis not present

## 2016-02-14 DIAGNOSIS — Z940 Kidney transplant status: Secondary | ICD-10-CM | POA: Diagnosis not present

## 2016-02-14 DIAGNOSIS — B370 Candidal stomatitis: Secondary | ICD-10-CM | POA: Diagnosis not present

## 2016-02-14 DIAGNOSIS — E119 Type 2 diabetes mellitus without complications: Secondary | ICD-10-CM | POA: Diagnosis not present

## 2016-02-14 DIAGNOSIS — B259 Cytomegaloviral disease, unspecified: Secondary | ICD-10-CM | POA: Diagnosis not present

## 2016-02-14 DIAGNOSIS — R945 Abnormal results of liver function studies: Secondary | ICD-10-CM | POA: Diagnosis not present

## 2016-02-22 ENCOUNTER — Ambulatory Visit (INDEPENDENT_AMBULATORY_CARE_PROVIDER_SITE_OTHER): Payer: Managed Care, Other (non HMO) | Admitting: Family Medicine

## 2016-02-22 ENCOUNTER — Encounter: Payer: Self-pay | Admitting: Family Medicine

## 2016-02-22 VITALS — BP 124/76 | HR 64 | Resp 18 | Ht 64.0 in | Wt 169.0 lb

## 2016-02-22 DIAGNOSIS — J3089 Other allergic rhinitis: Secondary | ICD-10-CM

## 2016-02-22 DIAGNOSIS — E8881 Metabolic syndrome: Secondary | ICD-10-CM

## 2016-02-22 DIAGNOSIS — F418 Other specified anxiety disorders: Secondary | ICD-10-CM

## 2016-02-22 DIAGNOSIS — Z1211 Encounter for screening for malignant neoplasm of colon: Secondary | ICD-10-CM | POA: Diagnosis not present

## 2016-02-22 DIAGNOSIS — I10 Essential (primary) hypertension: Secondary | ICD-10-CM

## 2016-02-22 DIAGNOSIS — E1121 Type 2 diabetes mellitus with diabetic nephropathy: Secondary | ICD-10-CM

## 2016-02-22 DIAGNOSIS — E785 Hyperlipidemia, unspecified: Secondary | ICD-10-CM

## 2016-02-22 DIAGNOSIS — Z940 Kidney transplant status: Secondary | ICD-10-CM

## 2016-02-22 MED ORDER — MONTELUKAST SODIUM 10 MG PO TABS: 10.0000 mg | ORAL_TABLET | Freq: Every day | ORAL | Status: DC

## 2016-02-22 NOTE — Patient Instructions (Addendum)
F/U in November, call if you need me before  New is daily singulair for allergy symptoms  You are referred for  colonoscopy in September when it is due  Please work on good  health habits so that your health will improve. 1. Commitment to daily physical activity for 30 to 60  minutes, if you are able to do this.  2. Commitment to wise food choices. Aim for half of your  food intake to be vegetable and fruit, one quarter starchy foods, and one quarter protein. Try to eat on a regular schedule  3 meals per day, snacking between meals should be limited to vegetables or fruits or small portions of nuts. 64 ounces of water per day is generally recommended, unless you have specific health conditions, like heart failure or kidney failure where you will need to limit fluid intake.  3. Commitment to sufficient and a  good quality of physical and mental rest daily, generally between 6 to 8 hours per day.  WITH PERSISTANCE AND PERSEVERANCE, THE IMPOSSIBLE , BECOMES THE NORM!

## 2016-02-22 NOTE — Progress Notes (Signed)
Subjective:    Patient ID: Michaela [name], female    DOB: [birth year], 60 y.o.   MRN: [id number]  HPI   Michaela [name]     MRN: [id number]      DOB: 1956/08/10   HPI Michaela [name] is here for follow up and re-evaluation of chronic medical conditions, medication management and review of any available recent lab and radiology data.  Preventive health is updated, specifically  Cancer screening and Immunization.   Questions or concerns regarding consultations or procedures which the PT has had in the interim are  addressed. The PT denies any adverse reactions to current medications since the last visit.  C/o increased nasal congestion and clear sinus drainage in past 1 to 2 weeks, no fever , chills , sore throat or cough [facility] and reports being up to date on all necessary screenings and immunizations, will send for record  ROS Denies recent fever or chills. Denies  ear pain or sore throat. Denies chest congestion, productive cough or wheezing. Denies chest pains, palpitations and leg swelling Denies abdominal pain, nausea, vomiting,diarrhea or constipation.   Denies dysuria, frequency, hesitancy or incontinence. Denies joint pain, swelling and limitation in mobility. Denies headaches, seizures, numbness, or tingling. Denies depression, anxiety or insomnia. Denies skin break down or rash.   PE  BP 124/76 mmHg  Pulse 64  Resp 18  Ht 5\' 4"  (1.626 m)  Wt 169 lb (76.658 kg)  BMI 28.99 kg/m2  SpO2 99%  Patient alert and oriented and in no cardiopulmonary distress.  HEENT: No facial asymmetry, EOMI,   oropharynx pink and moist.  Neck supple no JVD, no mass. No sinus tenderness, TM clear, mild erythema and edema of nasal mucosa Chest: Clear to auscultation bilaterally.  CVS: S1, S2 no murmurs, no S3.Regular rate.  ABD: Soft non tender.   Ext: No edema  MS: Adequate ROM spine, shoulders, hips and knees.  Skin: Intact, no  ulcerations or rash noted.  Psych: Good eye contact, normal affect. Memory intact not anxious or depressed appearing.  CNS: CN 2-12 intact, power,  normal throughout.no focal deficits noted.   Assessment & Plan   Essential hypertension Controlled, no change in medication DASH diet and commitment to daily physical activity for a minimum of 30 minutes discussed and encouraged, as a part of hypertension management. The importance of attaining a healthy weight is also discussed.  BP/Weight 02/22/2016 08/17/2015 11/08/2014 07/19/2014 03/22/2014 02/03/2014 [id number]  Systolic BP A999333 [id number] [id number]  Diastolic BP 76 80 78 72 78 80 70  Wt. (Lbs) 169 167.8 172.04 169 171 168.8 164.4  BMI 28.99 28.79 29.52 28.99 29.34 28.96 28.21        Type 2 diabetes with nephropathy Michaela [name] is reminded of the importance of commitment to daily physical activity for 30 minutes or more, as able and the need to limit carbohydrate intake to 30 to 60 grams per meal to help with blood sugar control.   The need to take medication as prescribed, test blood sugar as directed, and to call between visits if there is a concern that blood sugar is uncontrolled is also discussed.   Michaela [name] is reminded of the importance of daily foot exam, annual eye examination, and good blood sugar, blood pressure and cholesterol control.  Diabetic Labs Latest Ref Rng 07/20/2014 06/10/2014 02/03/2014 10/06/2013 09/30/2008  HbA1c - - 7.2 - - -  Microalbumin 0.00 -  1.89 mg/dL 0.94 - - - -  Micro/Creat Ratio 0.0 - 30.0 mg/g 4.2 - - - -  Chol 0 - 200 mg/dL - - 265(H) 214(H) 265(H)  HDL >39 mg/dL - - 44 36(L) 52  Calc LDL - - 157 180(H) 117(H) 150(H)  Triglycerides <150 mg/dL - - 204(H) 304(H) 313(H)  Creatinine 0.50 - 1.10 mg/dL - - 1.00 - 2.47(H)   BP/Weight 02/22/2016 08/17/2015 11/08/2014 07/19/2014 03/22/2014 02/03/2014 [id number]  Systolic BP A999333 [id number] 123456 A999333 [id number]  Diastolic BP 76 80 78 72 78 80 70  Wt. (Lbs) 169 167.8  172.04 169 171 168.8 164.4  BMI 28.99 28.79 29.52 28.99 29.34 28.96 28.21   Foot/eye exam completion dates 08/17/2015 07/19/2014  Foot Form Completion Done Done       Managed at the [facility], reports good control and that she takes metformin, dose unknown, will send for records and updated labs  Hyperlipidemia LDL goal <100 Hyperlipidemia:Low fat diet discussed and encouraged.   Lipid Panel  Lab Results  Component Value Date   CHOL 265* 02/03/2014   HDL 44 02/03/2014   LDLCALC 157 06/10/2014   TRIG 204* 02/03/2014   CHOLHDL 6.0 02/03/2014      Updated lab to be obtained from the [place]   Depression with anxiety Improved, currently receiving therapy through the [place] , which she finds very useful, still in a controlling marriage  Allergic rhinitis Increased symptoms with season change , pt to start daily treatment  Metabolic syndrome X The increased risk of cardiovascular disease associated with this diagnosis, and the need to consistently work on lifestyle to change this is discussed. Following  a  heart healthy diet ,commitment to 30 minutes of exercise at least 5 days per week, as well as control of blood sugar and cholesterol , and achieving a healthy weight are all the areas to be addressed .   History of renal transplant remains on immunosuppressive therapy, without complication       Review of Systems     Objective:   Physical Exam        Assessment & Plan:

## 2016-02-26 NOTE — Assessment & Plan Note (Signed)
Improved, currently receiving therapy through the [place] , which she finds very useful, still in a controlling marriage

## 2016-02-26 NOTE — Assessment & Plan Note (Signed)
Michaela [name] is reminded of the importance of commitment to daily physical activity for 30 minutes or more, as able and the need to limit carbohydrate intake to 30 to 60 grams per meal to help with blood sugar control.   The need to take medication as prescribed, test blood sugar as directed, and to call between visits if there is a concern that blood sugar is uncontrolled is also discussed.   Michaela [name] is reminded of the importance of daily foot exam, annual eye examination, and good blood sugar, blood pressure and cholesterol control.  Diabetic Labs Latest Ref Rng 07/20/2014 06/10/2014 02/03/2014 10/06/2013 09/30/2008  HbA1c - - 7.2 - - -  Microalbumin 0.00 - 1.89 mg/dL 0.94 - - - -  Micro/Creat Ratio 0.0 - 30.0 mg/g 4.2 - - - -  Chol 0 - 200 mg/dL - - 265(H) 214(H) 265(H)  HDL >39 mg/dL - - 44 36(L) 52  Calc LDL - - 157 180(H) 117(H) 150(H)  Triglycerides <150 mg/dL - - 204(H) 304(H) 313(H)  Creatinine 0.50 - 1.10 mg/dL - - 1.00 - 2.47(H)   BP/Weight 02/22/2016 08/17/2015 11/08/2014 07/19/2014 03/22/2014 02/03/2014 [id number]  Systolic BP A999333 [id number] [id number] A999333 [id number] [id number]  Diastolic BP 76 80 78 72 78 80 70  Wt. (Lbs) 169 167.8 172.04 169 171 168.8 164.4  BMI 28.99 28.79 29.52 28.99 29.34 28.96 28.21   Foot/eye exam completion dates 08/17/2015 07/19/2014  Foot Form Completion Done Done       Managed at the [facility], reports good control and that she takes metformin, dose unknown, will send for records and updated labs

## 2016-02-26 NOTE — Assessment & Plan Note (Signed)
remains on immunosuppressive therapy, without complication

## 2016-02-26 NOTE — Assessment & Plan Note (Signed)
Controlled, no change in medication DASH diet and commitment to daily physical activity for a minimum of 30 minutes discussed and encouraged, as a part of hypertension management. The importance of attaining a healthy weight is also discussed.  BP/Weight 02/22/2016 08/17/2015 11/08/2014 07/19/2014 03/22/2014 02/03/2014 [id number]  Systolic BP A999333 [id number] 123456 A999333 [id number]  Diastolic BP 76 80 78 72 78 80 70  Wt. (Lbs) 169 167.8 172.04 169 171 168.8 164.4  BMI 28.99 28.79 29.52 28.99 29.34 28.96 28.21

## 2016-02-26 NOTE — Assessment & Plan Note (Signed)
Increased symptoms with season change , pt to start daily treatment

## 2016-02-26 NOTE — Assessment & Plan Note (Signed)
Hyperlipidemia:Low fat diet discussed and encouraged.   Lipid Panel  Lab Results  Component Value Date   CHOL 265* 02/03/2014   HDL 44 02/03/2014   LDLCALC 157 06/10/2014   TRIG 204* 02/03/2014   CHOLHDL 6.0 02/03/2014      Updated lab to be obtained from the [place]

## 2016-02-26 NOTE — Assessment & Plan Note (Signed)
The increased risk of cardiovascular disease associated with this diagnosis, and the need to consistently work on lifestyle to change this is discussed. Following  a  heart healthy diet ,commitment to 30 minutes of exercise at least 5 days per week, as well as control of blood sugar and cholesterol , and achieving a healthy weight are all the areas to be addressed .  

## 2016-04-23 ENCOUNTER — Telehealth: Payer: Self-pay | Admitting: Family Medicine

## 2016-04-23 NOTE — Telephone Encounter (Signed)
Tele contact made and documented in a separate note

## 2016-04-23 NOTE — Telephone Encounter (Signed)
Tele contact made with spouse pt at home, has appt this week

## 2016-06-18 ENCOUNTER — Encounter: Payer: Self-pay | Admitting: Gastroenterology

## 2016-06-21 ENCOUNTER — Encounter: Payer: Self-pay | Admitting: Gastroenterology

## 2016-08-29 ENCOUNTER — Ambulatory Visit (INDEPENDENT_AMBULATORY_CARE_PROVIDER_SITE_OTHER): Payer: Managed Care, Other (non HMO)

## 2016-08-29 DIAGNOSIS — Z23 Encounter for immunization: Secondary | ICD-10-CM

## 2016-09-03 ENCOUNTER — Ambulatory Visit (INDEPENDENT_AMBULATORY_CARE_PROVIDER_SITE_OTHER): Payer: Managed Care, Other (non HMO) | Admitting: Gastroenterology

## 2016-09-03 ENCOUNTER — Encounter: Payer: Self-pay | Admitting: Gastroenterology

## 2016-09-03 ENCOUNTER — Encounter (INDEPENDENT_AMBULATORY_CARE_PROVIDER_SITE_OTHER): Payer: Self-pay

## 2016-09-03 VITALS — BP 114/68 | HR 60 | Ht 64.0 in | Wt 170.4 lb

## 2016-09-03 DIAGNOSIS — K588 Other irritable bowel syndrome: Secondary | ICD-10-CM | POA: Diagnosis not present

## 2016-09-03 DIAGNOSIS — R140 Abdominal distension (gaseous): Secondary | ICD-10-CM

## 2016-09-03 DIAGNOSIS — Z08 Encounter for follow-up examination after completed treatment for malignant neoplasm: Secondary | ICD-10-CM | POA: Diagnosis not present

## 2016-09-03 DIAGNOSIS — R143 Flatulence: Secondary | ICD-10-CM

## 2016-09-03 DIAGNOSIS — Z85038 Personal history of other malignant neoplasm of large intestine: Secondary | ICD-10-CM

## 2016-09-03 MED ORDER — NA SULFATE-K SULFATE-MG SULF 17.5-3.13-1.6 GM/177ML PO SOLN: 1.0000 | Freq: Once | ORAL | 0 refills | Status: AC

## 2016-09-03 NOTE — Progress Notes (Signed)
Michaela [name]    AF:[id number]    1956/10/02  Primary Care Physician:Margaret Moshe Cipro, MD  Referring Physician: Fayrene Helper, MD [address], [place], [place] [address]  Chief complaint:  Screening colonoscopy  HPI: 60 year old female with history of end-stage renal disease status post kidney transplant November 2013, currently on chronic immunosuppression here to discuss screening colonoscopy. Last colonoscopy in September 2012 with removal of 8 mm sessile polyp that was tubular adenoma.  On review of system patient complaints of intermittent bloating with excessive gas and cramps. She also notices intermittent bright red blood per rectum when she wipes with some anal itching and discomfort at times.   Outpatient Encounter Prescriptions as of 09/03/2016  Medication Sig  . aspirin 81 MG tablet Take 81 mg by mouth daily.  . ergocalciferol (VITAMIN D2) 50000 units capsule Take 50,000 Units by mouth once a week.  Marland Kitchen lisinopril (PRINIVIL,ZESTRIL) 10 MG tablet Take 1 tablet (10 mg total) by mouth daily.  . metoprolol (TOPROL-XL) 100 MG 24 hr tablet Take 50 mg by mouth 2 (two) times daily.   . montelukast (SINGULAIR) 10 MG tablet Take 1 tablet (10 mg total) by mouth at bedtime.  . mycophenolate (MYFORTIC) 180 MG EC tablet Take 360 mg by mouth. 2 tabs in the am and 2 in the pm  . omeprazole (PRILOSEC) 20 MG capsule Take 20 mg by mouth daily.  . pravastatin (PRAVACHOL) 40 MG tablet Take 40 mg by mouth daily.  . predniSONE (DELTASONE) 5 MG tablet Take 5 mg by mouth daily.  . tacrolimus (PROGRAF) 1 MG capsule Take 5 mg by mouth 2 (two) times daily.   No facility-administered encounter medications on file as of 09/03/2016.     Allergies as of 09/03/2016 - Review Complete 09/03/2016  Allergen Reaction Noted  . Ibuprofen    . Sulfonamide derivatives      Past Medical History:  Diagnosis Date  . Chronic kidney disease   . Depression   . Dialysis patient [facility])    M/W/F  waiting on a kidney transplant  . Hyperlipidemia   . Hypertension   . Presence of arterial-venous shunt (for dialysis) ([facility])    left arm    Past Surgical History:  Procedure Laterality Date  . CESAREAN SECTION    . DIALYSIS FISTULA CREATION    . 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 TUBAL LIGATION      Family History  Problem Relation Age of Onset  . Diabetes Father   . Cancer Sister     uterine/vulva  . Diabetes Brother     Social History   Social History  . Marital status: Married    Spouse name: N/A  . Number of children: N/A  . Years of education: N/A   Occupational History  . Not on file.   Social History Main Topics  . Smoking status: Never Smoker  . Smokeless tobacco: Never Used  . Alcohol use No  . Drug use: No  . Sexual activity: Not Currently   Other Topics Concern  . Not on file   Social History Narrative  . No narrative on file      Review of systems: Review of Systems  Constitutional: Negative for fever and chills.  positive for weight gain HENT: Negative.   positive for sinus problem Eyes: Negative for blurred vision.  Respiratory: Negative for cough, shortness of breath and wheezing.  positive for staying asleep, waking up and sleep Cardiovascular: Negative for chest pain and palpitations.  Gastrointestinal: as per HPI Genitourinary: Negative for dysuria, urgency, frequency and hematuria.  Musculoskeletal: Negative for myalgias, back pain and joint pain.  Skin: Negative for itching and rash.  Neurological: Negative for dizziness, tremors, focal weakness, seizures and loss of consciousness.  Endo/Heme/Allergies: Positive for seasonal allergies.  Psychiatric/Behavioral: Negative for depression, suicidal ideas and hallucinations.  All other systems reviewed and are negative.   Physical Exam: Vitals:   09/03/16 0844  BP: 114/68  Pulse: 60   Body mass index is 29.25 kg/m. Gen:      No acute distress HEENT:   EOMI, sclera anicteric Neck:     No masses; no thyromegaly Lungs:    Clear to auscultation bilaterally; normal respiratory effort CV:         Regular rate and rhythm; no murmurs Abd:      + bowel sounds; soft, non-tender; no palpable masses, no distension Ext:    No edema; adequate peripheral perfusion Skin:      Warm and dry; no rash Neuro: alert and oriented x 3 Psych: normal mood and affect  Data Reviewed:  Reviewed labs, radiology imaging, old records and pertinent past GI work up   Assessment and Plan/Recommendations:  60 year old female status post renal transplant for end-stage renal disease secondary to hypertension here to discuss surveillance colonoscopy Schedule for colonoscopy today The risks and benefits as well as alternatives of endoscopic procedure(s) have been discussed and reviewed. All questions answered. The patient agrees to proceed. Patient complaining of excessive bloating, flatus and cramps in the right lower abdomen We'll start probiotic align one capsule daily IB Gaurd as needed for symptom relief Intermittent bright red blood per rectum likely secondary to hemorrhoidal bleed, we'll schedule for hemorrhoidal band ligation after colonoscopy if has no other etiology  Greater than 50% of the time used for counseling as well as treatment plan and follow-up. She had multiple questions which were answered to her satisfaction  K. Denzil Magnuson , MD [phone] Mon-Fri 8a-5p [phone] after 5p, weekends, holidays  CC: Fayrene Helper, MD

## 2016-09-03 NOTE — Patient Instructions (Signed)
You have been scheduled for a colonoscopy. Please follow written instructions given to you at your visit today.  Please pick up your prep supplies at the pharmacy within the next 1-3 days. If you use inhalers (even only as needed), please bring them with you on the day of your procedure. Your physician has requested that you go to www.startemmi.com and enter the access code given to you at your visit today. This web site gives a general overview about your procedure. However, you should still follow specific instructions given to you by our office regarding your preparation for the procedure.  Use Align 1 capsule daily  Use IB Gard 1 capsule twice a day as needed

## 2016-09-14 ENCOUNTER — Encounter: Payer: Self-pay | Admitting: Gastroenterology

## 2016-09-24 ENCOUNTER — Encounter: Payer: Managed Care, Other (non HMO) | Admitting: Gastroenterology

## 2016-10-03 ENCOUNTER — Ambulatory Visit: Payer: Managed Care, Other (non HMO) | Admitting: Family Medicine

## 2016-10-04 ENCOUNTER — Encounter: Payer: Self-pay | Admitting: Family Medicine

## 2016-10-12 ENCOUNTER — Encounter: Payer: Self-pay | Admitting: Family Medicine

## 2017-01-08 ENCOUNTER — Encounter: Payer: Self-pay | Admitting: Gastroenterology

## 2017-01-09 ENCOUNTER — Encounter: Payer: Self-pay | Admitting: Gastroenterology

## 2017-02-07 ENCOUNTER — Ambulatory Visit: Payer: Managed Care, Other (non HMO) | Admitting: Family Medicine

## 2017-02-26 ENCOUNTER — Ambulatory Visit (AMBULATORY_SURGERY_CENTER): Payer: Self-pay

## 2017-02-26 VITALS — Ht 64.0 in | Wt 165.2 lb

## 2017-02-26 DIAGNOSIS — Z86010 Personal history of colonic polyps: Secondary | ICD-10-CM

## 2017-02-26 NOTE — Progress Notes (Signed)
Denies allergies to eggs or soy products. Denies complication of anesthesia or sedation. Denies use of weight loss medication. Denies use of O2.   Emmi instructions declined.   Patient states that she already has a Suprep Kit. Patient was previously scheduled and had to reschedule. Suprep was already picked up according to the patient. A new kit was not ordered.

## 2017-03-12 ENCOUNTER — Ambulatory Visit (AMBULATORY_SURGERY_CENTER): Payer: Non-veteran care | Admitting: Gastroenterology

## 2017-03-12 ENCOUNTER — Encounter: Payer: Self-pay | Admitting: Gastroenterology

## 2017-03-12 VITALS — BP 125/77 | HR 64 | Temp 97.1°F | Resp 13 | Ht 64.0 in | Wt 170.0 lb

## 2017-03-12 DIAGNOSIS — Z86010 Personal history of colonic polyps: Secondary | ICD-10-CM

## 2017-03-12 DIAGNOSIS — K635 Polyp of colon: Secondary | ICD-10-CM | POA: Diagnosis not present

## 2017-03-12 DIAGNOSIS — D127 Benign neoplasm of rectosigmoid junction: Secondary | ICD-10-CM

## 2017-03-12 MED ORDER — SODIUM CHLORIDE 0.9 % IV SOLN: 500.0000 mL | INTRAVENOUS | Status: AC

## 2017-03-12 NOTE — Progress Notes (Signed)
Called to room to assist during endoscopic procedure.  Patient ID and intended procedure confirmed with present staff. Received instructions for my participation in the procedure from the performing physician.  

## 2017-03-12 NOTE — Progress Notes (Signed)
A and O x3. Report to RN. Tolerated MAC anesthesia well.

## 2017-03-12 NOTE — Op Note (Signed)
[place] Patient Name: Michaela [name] Procedure Date: 03/12/2017 9:31 AM MRN: [id number] Endoscopist: Mauri Pole , MD Age: 61 Referring MD:  Date of Birth: 11-Mar-1956 Gender: Female Account #: [id number] Procedure:                Colonoscopy Indications:              High risk colon cancer surveillance: Personal                            history of adenoma less than 10 mm in size, Last                            colonoscopy: 2012 Medicines:                Monitored Anesthesia Care Procedure:                Pre-Anesthesia Assessment:                           - Prior to the procedure, a History and Physical                            was performed, and patient medications and                            allergies were reviewed. The patient's tolerance of                            previous anesthesia was also reviewed. The risks                            and benefits of the procedure and the sedation                            options and risks were discussed with the patient.                            All questions were answered, and informed consent                            was obtained. Prior Anticoagulants: The patient has                            taken no previous anticoagulant or antiplatelet                            agents. ASA Grade Assessment: II - A patient with                            mild systemic disease. After reviewing the risks                            and benefits, the patient was deemed in  satisfactory condition to undergo the procedure.                           After obtaining informed consent, the colonoscope                            was passed under direct vision. Throughout the                            procedure, the patient's blood pressure, pulse, and                            oxygen saturations were monitored continuously. The                            Colonoscope was introduced through the anus  and                            advanced to the the terminal ileum, with                            identification of the appendiceal orifice and IC                            valve. The colonoscopy was performed without                            difficulty. The patient tolerated the procedure                            well. The quality of the bowel preparation was                            excellent. The terminal ileum, ileocecal valve,                            appendiceal orifice, and rectum were photographed. Scope In: 9:44:24 AM Scope Out: 9:58:05 AM Scope Withdrawal Time: 0 hours 10 minutes 6 seconds  Total Procedure Duration: 0 hours 13 minutes 41 seconds  Findings:                 The perianal and digital rectal examinations were                            normal.                           Four flat polyps were found in the recto-sigmoid                            colon. The polyps were diminutive in size,                            hyperplastic appearing. These polyps were removed  with a cold biopsy forceps. Resection and retrieval                            were complete.                           Non-bleeding internal hemorrhoids were found during                            retroflexion. The hemorrhoids were small. Complications:            No immediate complications. Estimated Blood Loss:     Estimated blood loss was minimal. Impression:               - Four diminutive polyps at the recto-sigmoid                            colon, removed with a cold biopsy forceps. Resected                            and retrieved.                           - Non-bleeding internal hemorrhoids. Recommendation:           - Patient has a contact number available for                            emergencies. The signs and symptoms of potential                            delayed complications were discussed with the                            patient. Return to normal  activities tomorrow.                            Written discharge instructions were provided to the                            patient.                           - Resume previous diet.                           - Continue present medications.                           - Await pathology results.                           - Repeat colonoscopy in 5-10 years for surveillance                            based on pathology results. Mauri Pole, MD 03/12/2017 10:10:01 AM This report has been signed electronically.

## 2017-03-12 NOTE — Patient Instructions (Signed)
YOU HAD AN ENDOSCOPIC PROCEDURE TODAY AT [facility]:   Refer to the procedure report that was given to you for any specific questions about what was found during the examination.  If the procedure report does not answer your questions, please call your gastroenterologist to clarify.  If you requested that your care partner not be given the details of your procedure findings, then the procedure report has been included in a sealed envelope for you to review at your convenience later.  YOU SHOULD EXPECT: Some feelings of bloating in the abdomen. Passage of more gas than usual.  Walking can help get rid of the air that was put into your GI tract during the procedure and reduce the bloating. If you had a lower endoscopy (such as a colonoscopy or flexible sigmoidoscopy) you may notice spotting of blood in your stool or on the toilet paper. If you underwent a bowel prep for your procedure, you may not have a normal bowel movement for a few days.  Please Note:  You might notice some irritation and congestion in your nose or some drainage.  This is from the oxygen used during your procedure.  There is no need for concern and it should clear up in a day or so.  SYMPTOMS TO REPORT IMMEDIATELY:   Following lower endoscopy (colonoscopy or flexible sigmoidoscopy):  Excessive amounts of blood in the stool  Significant tenderness or worsening of abdominal pains  Swelling of the abdomen that is new, acute  Fever of 100F or higher   For urgent or emergent issues, a gastroenterologist can be reached at any hour by calling [phone].   DIET:  We do recommend a small meal at first, but then you may proceed to your regular diet.  Drink plenty of fluids but you should avoid alcoholic beverages for 24 hours.  ACTIVITY:  You should plan to take it easy for the rest of today and you should NOT DRIVE or use heavy machinery until tomorrow (because of the sedation medicines used during the test).     FOLLOW UP: Our staff will call the number listed on your records the next business day following your procedure to check on you and address any questions or concerns that you may have regarding the information given to you following your procedure. If we do not reach you, we will leave a message.  However, if you are feeling well and you are not experiencing any problems, there is no need to return our call.  We will assume that you have returned to your regular daily activities without incident.  If any biopsies were taken you will be contacted by phone or by letter within the next 1-3 weeks.  Please call us at [phone] if you have not heard about the biopsies in 3 weeks.    SIGNATURES/CONFIDENTIALITY: You and/or your care partner have signed paperwork which will be entered into your electronic medical record.  These signatures attest to the fact that that the information above on your After Visit Summary has been reviewed and is understood.  Full responsibility of the confidentiality of this discharge information lies with you and/or your care-partner.  Polyp and hemorrhoid information given.   Recall colonoscopy 5-10 years based on pathology results.

## 2017-03-13 ENCOUNTER — Telehealth: Payer: Self-pay | Admitting: *Deleted

## 2017-03-13 NOTE — Telephone Encounter (Signed)
  Follow up Call-  Call back number 03/12/2017  Post procedure Call Back phone  # [phone]  Permission to leave phone message Yes  Some recent data might be hidden     Patient questions:  Do you have a fever, pain , or abdominal swelling? No. Pain Score  0 *  Have you tolerated food without any problems? Yes.    Have you been able to return to your normal activities? Yes.    Do you have any questions about your discharge instructions: Diet   No. Medications  No. Follow up visit  No.  Do you have questions or concerns about your Care? No.  Actions: * If pain score is 4 or above: No action needed, pain <4.

## 2017-03-13 NOTE — Telephone Encounter (Signed)
  Follow up Call-  Call back number 03/12/2017  Post procedure Call Back phone  # [phone]  Permission to leave phone message Yes  Some recent data might be hidden    No answer at # given.  Left message that we would try again this afternoon.

## 2017-03-13 NOTE — Telephone Encounter (Deleted)
  Follow up Call-  Call back number 03/12/2017  Post procedure Call Back phone  # [phone]  Permission to leave phone message Yes  Some recent data might be hidden    [facility]

## 2017-03-18 ENCOUNTER — Encounter: Payer: Self-pay | Admitting: Gastroenterology

## 2017-03-25 ENCOUNTER — Ambulatory Visit (INDEPENDENT_AMBULATORY_CARE_PROVIDER_SITE_OTHER): Payer: Managed Care, Other (non HMO) | Admitting: Family Medicine

## 2017-03-25 ENCOUNTER — Encounter: Payer: Self-pay | Admitting: Family Medicine

## 2017-03-25 VITALS — BP 120/70 | HR 67 | Resp 16 | Ht 64.0 in | Wt 164.0 lb

## 2017-03-25 DIAGNOSIS — I10 Essential (primary) hypertension: Secondary | ICD-10-CM

## 2017-03-25 DIAGNOSIS — E785 Hyperlipidemia, unspecified: Secondary | ICD-10-CM | POA: Diagnosis not present

## 2017-03-25 DIAGNOSIS — F418 Other specified anxiety disorders: Secondary | ICD-10-CM | POA: Diagnosis not present

## 2017-03-25 DIAGNOSIS — Z1231 Encounter for screening mammogram for malignant neoplasm of breast: Secondary | ICD-10-CM

## 2017-03-25 DIAGNOSIS — E1121 Type 2 diabetes mellitus with diabetic nephropathy: Secondary | ICD-10-CM

## 2017-03-25 DIAGNOSIS — J302 Other seasonal allergic rhinitis: Secondary | ICD-10-CM

## 2017-03-25 DIAGNOSIS — Z1239 Encounter for other screening for malignant neoplasm of breast: Secondary | ICD-10-CM

## 2017-03-25 NOTE — Patient Instructions (Addendum)
f/u in 1 year, call if you need me before.  When you get  Medicare, call and come in for welcome to medicare visit  Pls schedule mammogram at breast center, just call [phone]   Fasting lipid, cmp, hBA1C , tSH , Vit D and HIV test as soon as possible through the [facility]  Continue therapy this is helpful  It is important that you exercise regularly at least 30 minutes 5 times a week. If you develop chest pain, have severe difficulty breathing, or feel very tired, stop exercising immediately and seek medical attention

## 2017-03-27 NOTE — Progress Notes (Signed)
Michaela [name]     MRN: [id number]      DOB: Apr 09, 1956   HPI Michaela [name] is here for follow up and re-evaluation of chronic medical conditions, medication management and review of any available recent lab and radiology data.  Preventive health is updated, specifically  Cancer screening and Immunization.   .Most of her health care is through the [facility] , she no longer has medicare as she is not on dialysis, unsure of coverage and I have encouraged her to do as she is essentially doing , which is primary health care delivery at [facility], keep Docs in the community that she chooses, hopes to have medicare next year The PT denies any adverse reactions to current medications since the last visit.  Sees therapist every 2 to 3 weeks which is very helpful for her. Blood sugar not as well controlled as desired , but she is working on this , I have asked for new labs , last HBA1C was over 8  ROS Denies recent fever or chills. Denies sinus pressure, nasal congestion, ear pain or sore throat. Denies chest congestion, productive cough or wheezing. Denies chest pains, palpitations and leg swelling Denies abdominal pain, nausea, vomiting,diarrhea or constipation.   Denies dysuria, frequency, hesitancy or incontinence. Denies joint pain, swelling and limitation in mobility. Denies headaches, seizures, numbness, or tingling. Denies uncontrolled epression, anxiety or insomnia. Denies skin break down or rash.   PE  BP 120/70   Pulse 67   Resp 16   Ht 5\' 4"  (1.626 m)   Wt 164 lb (74.4 kg)   SpO2 100%   BMI 28.15 kg/m   Patient alert and oriented and in no cardiopulmonary distress.  HEENT: No facial asymmetry, EOMI,   oropharynx pink and moist.  Neck supple no JVD, no mass.  Chest: Clear to auscultation bilaterally.  CVS: S1, S2 no murmurs, no S3.Regular rate.  ABD: Soft non tender.   Ext: No edema  MS: Adequate ROM spine, shoulders, hips and knees.  Skin: Intact, no ulcerations or rash  noted.  Psych: Good eye contact, normal affect. Memory intact not anxious or depressed appearing.  CNS: CN 2-12 intact, power,  normal throughout.no focal deficits noted.   Assessment & Plan Essential hypertension Controlled, no change in medication DASH diet and commitment to daily physical activity for a minimum of 30 minutes discussed and encouraged, as a part of hypertension management. The importance of attaining a healthy weight is also discussed.  BP/Weight 03/25/2017 03/12/2017 02/26/2017 09/03/2016 02/22/2016 08/17/2015 93/81/0175  Systolic BP 102 585 - 277 824 235 361  Diastolic BP 70 77 - 68 76 80 78  Wt. (Lbs) 164 170 165.2 170.4 169 167.8 172.04  BMI 28.15 29.18 28.36 29.25 28.99 28.79 29.52       Depression with anxiety Treated through the [place] with physical therapy and medication, she has the challenge of an unhealthy marriage  Allergic rhinitis Controlled, no change in medication   Hyperlipidemia LDL goal <100 Request updated lab from the [facility] when she next goes Hyperlipidemia:Low fat diet discussed and encouraged.   Lipid Panel       Type 2 diabetes with nephropathy Uncontrolled Need updated lab Michaela [name] is reminded of the importance of commitment to daily physical activity for 30 minutes or more, as able and the need to limit carbohydrate intake to 30 to 60 grams per meal to help with blood sugar control.   The need to take medication as prescribed, test blood sugar  as directed, and to call between visits if there is a concern that blood sugar is uncontrolled is also discussed.   Michaela [name] is reminded of the importance of daily foot exam, annual eye examination, and good blood sugar, blood pressure and cholesterol control.  Diabetic Labs Latest Ref Rng & Units 07/20/2014 06/10/2014 02/03/2014 10/06/2013 09/30/2008  HbA1c - - 7.2 - - -  Microalbumin 0.00 - 1.89 mg/dL 0.94 - - - -  Micro/Creat Ratio 0.0 - 30.0 mg/g 4.2 - - - -  Chol 0 - 200 mg/dL - - 265(H)  214(H) 265(H)  HDL >39 mg/dL - - 44 36(L) 52  Calc LDL mg/dL - 157 180(H) 117(H) 150(H)  Triglycerides <150 mg/dL - - 204(H) 304(H) 313(H)  Creatinine 0.50 - 1.10 mg/dL - - 1.00 - 2.47(H)   BP/Weight 03/25/2017 03/12/2017 02/26/2017 09/03/2016 02/22/2016 08/17/2015 79/12/4095  Systolic BP 353 299 - 242 683 419 622  Diastolic BP 70 77 - 68 76 80 78  Wt. (Lbs) 164 170 165.2 170.4 169 167.8 172.04  BMI 28.15 29.18 28.36 29.25 28.99 28.79 29.52   Foot/eye exam completion dates 03/25/2017 08/17/2015  Foot Form Completion Done Done

## 2017-03-29 NOTE — Assessment & Plan Note (Signed)
Controlled, no change in medication DASH diet and commitment to daily physical activity for a minimum of 30 minutes discussed and encouraged, as a part of hypertension management. The importance of attaining a healthy weight is also discussed.  BP/Weight 03/25/2017 03/12/2017 02/26/2017 09/03/2016 02/22/2016 08/17/2015 77/93/9688  Systolic BP 648 472 - 072 182 883 374  Diastolic BP 70 77 - 68 76 80 78  Wt. (Lbs) 164 170 165.2 170.4 169 167.8 172.04  BMI 28.15 29.18 28.36 29.25 28.99 28.79 29.52

## 2017-03-29 NOTE — Assessment & Plan Note (Addendum)
Request updated lab from the [facility] when she next goes Hyperlipidemia:Low fat diet discussed and encouraged.   Lipid Panel

## 2017-03-29 NOTE — Assessment & Plan Note (Signed)
Treated through the [place] with physical therapy and medication, she has the challenge of an unhealthy marriage

## 2017-03-29 NOTE — Assessment & Plan Note (Signed)
Uncontrolled Need updated lab Michaela [name] is reminded of the importance of commitment to daily physical activity for 30 minutes or more, as able and the need to limit carbohydrate intake to 30 to 60 grams per meal to help with blood sugar control.   The need to take medication as prescribed, test blood sugar as directed, and to call between visits if there is a concern that blood sugar is uncontrolled is also discussed.   Michaela [name] is reminded of the importance of daily foot exam, annual eye examination, and good blood sugar, blood pressure and cholesterol control.  Diabetic Labs Latest Ref Rng & Units 07/20/2014 06/10/2014 02/03/2014 10/06/2013 09/30/2008  HbA1c - - 7.2 - - -  Microalbumin 0.00 - 1.89 mg/dL 0.94 - - - -  Micro/Creat Ratio 0.0 - 30.0 mg/g 4.2 - - - -  Chol 0 - 200 mg/dL - - 265(H) 214(H) 265(H)  HDL >39 mg/dL - - 44 36(L) 52  Calc LDL mg/dL - 157 180(H) 117(H) 150(H)  Triglycerides <150 mg/dL - - 204(H) 304(H) 313(H)  Creatinine 0.50 - 1.10 mg/dL - - 1.00 - 2.47(H)   BP/Weight 03/25/2017 03/12/2017 02/26/2017 09/03/2016 02/22/2016 08/17/2015 89/38/1017  Systolic BP 510 258 - 527 782 423 536  Diastolic BP 70 77 - 68 76 80 78  Wt. (Lbs) 164 170 165.2 170.4 169 167.8 172.04  BMI 28.15 29.18 28.36 29.25 28.99 28.79 29.52   Foot/eye exam completion dates 03/25/2017 08/17/2015  Foot Form Completion Done Done

## 2017-03-29 NOTE — Assessment & Plan Note (Signed)
Controlled, no change in medication  

## 2017-05-31 ENCOUNTER — Other Ambulatory Visit: Payer: Self-pay | Admitting: Family Medicine

## 2017-05-31 DIAGNOSIS — Z1231 Encounter for screening mammogram for malignant neoplasm of breast: Secondary | ICD-10-CM

## 2017-09-16 ENCOUNTER — Ambulatory Visit (INDEPENDENT_AMBULATORY_CARE_PROVIDER_SITE_OTHER): Payer: Managed Care, Other (non HMO)

## 2017-09-16 DIAGNOSIS — Z23 Encounter for immunization: Secondary | ICD-10-CM | POA: Diagnosis not present

## 2017-12-04 ENCOUNTER — Other Ambulatory Visit: Payer: Self-pay | Admitting: Family Medicine

## 2017-12-04 DIAGNOSIS — Z1231 Encounter for screening mammogram for malignant neoplasm of breast: Secondary | ICD-10-CM

## 2017-12-27 ENCOUNTER — Ambulatory Visit
Admission: RE | Admit: 2017-12-27 | Discharge: 2017-12-27 | Disposition: A | Discharge status: Home / Self Care | Payer: Managed Care, Other (non HMO) | Source: Ambulatory Visit | Attending: Family Medicine | Admitting: Family Medicine

## 2017-12-27 DIAGNOSIS — Z1231 Encounter for screening mammogram for malignant neoplasm of breast: Secondary | ICD-10-CM

## 2018-01-10 ENCOUNTER — Telehealth: Payer: Self-pay

## 2018-01-10 NOTE — Telephone Encounter (Signed)
Really cannot give advice without seeing her.  If it is red with no discomfort, no discharge, and no visual changes she can wait and see me Monday

## 2018-01-10 NOTE — Telephone Encounter (Signed)
Rt eye was blood red 2 days ago and she used OTC drops.  Today it is blood red again and wants to know what to do.  I suggested that she go to urgent care and she said she wants to talk with a nurse. Please call her at [phone]

## 2018-01-14 NOTE — Telephone Encounter (Signed)
Called patient to make appt, she states she the redness has been coming and going. She says she will call back to schedule if it happens again.

## 2018-03-26 ENCOUNTER — Ambulatory Visit: Payer: Non-veteran care | Admitting: Family Medicine

## 2018-04-10 ENCOUNTER — Telehealth: Payer: Self-pay | Admitting: Family Medicine

## 2018-04-10 NOTE — Telephone Encounter (Signed)
Patient is requesting a call from [facility] to discuss her insulin. She is aware Dr.Simpson is out of the office until next week .

## 2018-04-16 NOTE — Telephone Encounter (Signed)
I don't even see insulin on her medlist. Hasn't been in for OV for over a year. Has a follow up appt 05/06/18

## 2018-05-06 ENCOUNTER — Encounter: Payer: Self-pay | Admitting: Family Medicine

## 2018-05-06 ENCOUNTER — Ambulatory Visit (INDEPENDENT_AMBULATORY_CARE_PROVIDER_SITE_OTHER): Payer: Managed Care, Other (non HMO) | Admitting: Family Medicine

## 2018-05-06 VITALS — BP 130/78 | HR 60 | Resp 16 | Ht 64.0 in | Wt 162.0 lb

## 2018-05-06 DIAGNOSIS — I10 Essential (primary) hypertension: Secondary | ICD-10-CM | POA: Diagnosis not present

## 2018-05-06 DIAGNOSIS — E663 Overweight: Secondary | ICD-10-CM

## 2018-05-06 DIAGNOSIS — E1165 Type 2 diabetes mellitus with hyperglycemia: Secondary | ICD-10-CM | POA: Diagnosis not present

## 2018-05-06 DIAGNOSIS — E785 Hyperlipidemia, unspecified: Secondary | ICD-10-CM | POA: Diagnosis not present

## 2018-05-06 DIAGNOSIS — E1121 Type 2 diabetes mellitus with diabetic nephropathy: Secondary | ICD-10-CM

## 2018-05-06 DIAGNOSIS — IMO0002 Reserved for concepts with insufficient information to code with codable children: Secondary | ICD-10-CM

## 2018-05-06 NOTE — Patient Instructions (Addendum)
F/U early September call if you need me sooner  Tetst and record blood sugar at least once daily , before breakfasts and at bedtime Goal for fasting blood sugar ranges from 80 to 120 and 2 hours after any meal or at bedtime should be between 130 to 170.   We will give you basic information as to how to eat as a diabetic , this is very important  Please get HBA1C , chem 7 and EGFR  At end  August 

## 2018-05-08 DIAGNOSIS — E1121 Type 2 diabetes mellitus with diabetic nephropathy: Secondary | ICD-10-CM

## 2018-05-10 ENCOUNTER — Encounter: Payer: Self-pay | Admitting: Family Medicine

## 2018-05-10 DIAGNOSIS — E663 Overweight: Secondary | ICD-10-CM | POA: Insufficient documentation

## 2018-05-10 NOTE — Assessment & Plan Note (Addendum)
Uncontrolled, patient reports that her recent HBA1C at the [facility] was over 9. She does not test, does not know how, does not have a meter. Her basic knowledge about carb counting etc is very poor. States that her husband who is diabetic helps her with testing. Her main health care is through the [facility] [place], will order rept HBa1C and chem 7 and EGFR for end August Need copy of recent labs from the [facility] [place] Needs diabetic education, will refer locally if not able to get this through the [facility] [place]

## 2018-05-10 NOTE — Assessment & Plan Note (Signed)
unchanged. Patient re-educated about  the importance of commitment to a  minimum of 150 minutes of exercise per week.  The importance of healthy food choices with portion control discussed. Encouraged to start a food diary, count calories and to consider  joining a support group. Sample diet sheets offered. Goals set by the patient for the next several months.   Weight /BMI 05/06/2018 03/25/2017 03/12/2017  WEIGHT 162 lb 164 lb 170 lb  HEIGHT 5\' 4"  5\' 4"  5\' 4"   BMI 27.81 kg/m2 28.15 kg/m2 29.18 kg/m2

## 2018-05-10 NOTE — Assessment & Plan Note (Signed)
Controlled, no change in medication DASH diet and commitment to daily physical activity for a minimum of 30 minutes discussed and encouraged, as a part of hypertension management. The importance of attaining a healthy weight is also discussed.  BP/Weight 05/06/2018 03/25/2017 03/12/2017 02/26/2017 09/03/2016 02/22/2016 04/12/3436  Systolic BP 357 897 847 - 841 282 081  Diastolic BP 78 70 77 - 68 76 80  Wt. (Lbs) 162 164 170 165.2 170.4 169 167.8  BMI 27.81 28.15 29.18 28.36 29.25 28.99 28.79

## 2018-05-10 NOTE — Progress Notes (Signed)
Michaela [name]     MRN: [id number]      DOB: 1956-03-14   HPI Michaela [name] is here for follow up and re-evaluation of chronic medical conditions, Michaela [name] specific concern is that Michaela [name] diabetes is extremely uncontrolled, she states she recently was told that Michaela [name] HBA1C was over 9, and she is very uniformed as far as basic diabetes management, she is unable test , she has no meter, despite being diabetic for approx 3 years. Michaela [name] care is through the [place] She denies polyuria , polydipsia or excessive fatigue, she has no meter currently, and is provided with one and educated as to its use at the visit  ROS Denies recent fever or chills. Denies sinus pressure, nasal congestion, ear pain or sore throat. Denies chest congestion, productive cough or wheezing. Denies chest pains, palpitations and leg swelling Denies abdominal pain, nausea, vomiting,diarrhea or constipation.   Denies dysuria, frequency, hesitancy or incontinence. Denies joint pain, swelling and limitation in mobility. Denies headaches, seizures, numbness, or tingling. Denies  depression, anxiety or insomnia. Denies skin break down or rash.   PE  BP 130/78   Pulse 60   Resp 16   Ht 5' 4" (1.626 m)   Wt 162 lb (73.5 kg)   SpO2 99%   BMI 27.81 kg/m   Patient alert and oriented and in no cardiopulmonary distress.  HEENT: No facial asymmetry, EOMI,   oropharynx pink and moist.  Neck supple no JVD, no mass.  Chest: Clear to auscultation bilaterally.  CVS: S1, S2 no murmurs, no S3.Regular rate.  ABD: Soft non tender.   Ext: No edema  MS: Adequate ROM spine, shoulders, hips and knees.  Skin: Intact, no ulcerations or rash noted.  Psych: Good eye contact, normal affect. Memory intact not anxious or depressed appearing.  CNS: CN 2-12 intact, power,  normal throughout.no focal deficits noted.   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 She does not test, does not know how, does not have a meter. Michaela [name] basic knowledge about carb counting etc is very poor. States that Michaela [name] Michaela [name] who is diabetic helps Michaela [name] with testing. Michaela [name] main health care is through the [place], will order rept HBa1C and chem 7 and EGFR for end August Need copy of recent labs from the [facility] [place] Needs diabetic education, will refer locally if not able to get this through the [facility]  Essential hypertension Controlled, no change in medication DASH diet and commitment to daily physical activity for a minimum of 30 minutes discussed and encouraged, as a part of hypertension management. The importance of attaining a healthy weight is also discussed.  BP/Weight 05/06/2018 03/25/2017 03/12/2017 02/26/2017 09/03/2016 02/22/2016 9/76/7341  Systolic BP 937 902 409 - 735 329 924  Diastolic BP 78 70 77 - 68 76 80  Wt. (Lbs) 162 164 170 165.2 170.4 169 167.8  BMI 27.81 28.15 29.18 28.36 29.25 28.99 28.79       Hyperlipidemia LDL goal <100 Updated lab needed for review, will need to obtain most recent  Overweight (BMI 25.0-29.9) unchanged. Patient re-educated about  the importance of commitment to a  minimum of 150 minutes of exercise per week.  The importance of healthy food choices with portion control discussed. Encouraged to start a food diary, count calories and to consider  joining a support group. Sample diet sheets offered. Goals set by the patient for the next  several months.   Weight /BMI 05/06/2018 03/25/2017 03/12/2017  WEIGHT 162 lb 164 lb 170 lb  HEIGHT 5' 4" 5' 4" 5' 4"  BMI 27.81 kg/m2 28.15 kg/m2 29.18 kg/m2       

## 2018-05-10 NOTE — Assessment & Plan Note (Signed)
Updated lab needed for review, will need to obtain most recent

## 2018-05-28 ENCOUNTER — Telehealth: Payer: Self-pay | Admitting: Family Medicine

## 2018-05-28 NOTE — Telephone Encounter (Signed)
I personally spoke with the pt and I also advised her that she needs a thorough clinical evaluation , since she gets most of her care at the [facility] , and may even need pelvic US I recommended she go to the [facility] [place], said she would call them after she spoke with me, saud her husband was at [facility] and would take her

## 2018-05-28 NOTE — Telephone Encounter (Signed)
Patient states this is an emergency. She has a temp. Of 101, mucus drainage, chlls, and pelvic pain. Requesting an appt. Cb# [phone]

## 2018-05-28 NOTE — Telephone Encounter (Signed)
Spoke with patient and advised her to go to an urgent care for evaluation or the closest ED. She is asking if there is anything Dr.Simpson could call in for her. I told her probably not because of the fever and pelvic pain, but I would send the message anyway. I thought it would be in her best interest to see medical attention at an urgent care. She verbalized understanding.

## 2018-06-09 ENCOUNTER — Ambulatory Visit: Payer: Managed Care, Other (non HMO) | Admitting: Nutrition

## 2018-06-09 ENCOUNTER — Telehealth: Payer: Self-pay | Admitting: Nutrition

## 2018-06-09 NOTE — Telephone Encounter (Signed)
Cm to call and reschedule missed appt.

## 2018-06-10 ENCOUNTER — Other Ambulatory Visit: Payer: Self-pay | Admitting: Family Medicine

## 2018-06-10 ENCOUNTER — Telehealth: Payer: Self-pay | Admitting: Family Medicine

## 2018-06-10 DIAGNOSIS — I10 Essential (primary) hypertension: Principal | ICD-10-CM

## 2018-06-10 DIAGNOSIS — E1159 Type 2 diabetes mellitus with other circulatory complications: Secondary | ICD-10-CM

## 2018-06-10 DIAGNOSIS — Z940 Kidney transplant status: Secondary | ICD-10-CM

## 2018-06-10 DIAGNOSIS — R109 Unspecified abdominal pain: Secondary | ICD-10-CM

## 2018-06-10 NOTE — Telephone Encounter (Signed)
Request [place] of right kidney, h/o right kidney transplanry, has hTN, will order [place] of both kidnbney and give h/o right tratransplanrt, does  C/o right flank pain x 3 weeks, please schedule ( if able) order is placed Pt is aware , I spoke with her

## 2018-06-10 NOTE — Telephone Encounter (Signed)
Can she have a referral , forwarding to [facility]

## 2018-06-10 NOTE — Telephone Encounter (Signed)
Pt is calling to see if there is somewhere close so she can have a sonogram done, so she does not have to go to the [facility].  Can she get a referral.

## 2018-06-10 NOTE — Progress Notes (Signed)
Us renal

## 2018-06-11 ENCOUNTER — Telehealth: Payer: Self-pay

## 2018-06-11 DIAGNOSIS — I10 Essential (primary) hypertension: Secondary | ICD-10-CM

## 2018-06-11 DIAGNOSIS — Z940 Kidney transplant status: Secondary | ICD-10-CM

## 2018-06-11 DIAGNOSIS — R109 Unspecified abdominal pain: Secondary | ICD-10-CM

## 2018-06-11 NOTE — Telephone Encounter (Signed)
Please see below, thanks.

## 2018-06-11 NOTE — Telephone Encounter (Signed)
Renal ultrasound order per Dr.Simpson

## 2018-06-16 ENCOUNTER — Ambulatory Visit (HOSPITAL_COMMUNITY): Payer: Managed Care, Other (non HMO)

## 2018-08-12 ENCOUNTER — Ambulatory Visit: Payer: Managed Care, Other (non HMO) | Admitting: Family Medicine

## 2018-09-03 ENCOUNTER — Telehealth: Payer: Self-pay | Admitting: Family Medicine

## 2018-09-03 NOTE — Telephone Encounter (Signed)
Patient left voicemail requesting to know when she can get flu shot. I left her a detailed voicemail.

## 2018-09-15 ENCOUNTER — Telehealth: Payer: Self-pay | Admitting: Family Medicine

## 2018-09-15 NOTE — Telephone Encounter (Signed)
Please call the pt at [phone] she needed to talk to you

## 2018-09-16 NOTE — Telephone Encounter (Signed)
Called patient and left message for them to return call at the office   

## 2018-09-17 ENCOUNTER — Telehealth: Payer: Self-pay | Admitting: Family Medicine

## 2018-09-17 NOTE — Telephone Encounter (Signed)
Pt is calling wanting to speak with Nurse Theadora Rama

## 2018-09-18 NOTE — Telephone Encounter (Signed)
PT calling again -she only wants to know how often she has to have a pap smear

## 2018-09-18 NOTE — Telephone Encounter (Signed)
Spoke with patient and advised her she is now past due for her pap smear. She stated she usually gets them done at the [facility]

## 2018-09-22 ENCOUNTER — Ambulatory Visit (INDEPENDENT_AMBULATORY_CARE_PROVIDER_SITE_OTHER): Payer: Managed Care, Other (non HMO)

## 2018-09-22 DIAGNOSIS — Z23 Encounter for immunization: Secondary | ICD-10-CM

## 2018-09-22 NOTE — Telephone Encounter (Signed)
Called patient and left message for them to return call at the office  Per [place] spoke with her already concerning her question

## 2018-10-28 ENCOUNTER — Telehealth: Payer: Self-pay | Admitting: Family Medicine

## 2018-10-28 NOTE — Telephone Encounter (Signed)
Pt is calling in she feels bad, with a cough for a week now, has rattling and thick yellow mucus, her husband was sick prior. Can you please call her.

## 2018-10-28 NOTE — Telephone Encounter (Signed)
Patient states she has a deep rattling cough and she is bringing up a lot of yellow mucus. This has been going on for about a week now. Her husband has had the same thing. Is there anything you could call in?

## 2018-10-29 ENCOUNTER — Other Ambulatory Visit: Payer: Self-pay | Admitting: Family Medicine

## 2018-10-29 MED ORDER — AZITHROMYCIN 250 MG PO TABS: ORAL_TABLET | ORAL | 0 refills | Status: DC

## 2018-10-29 MED ORDER — BENZONATATE 100 MG PO CAPS: 100.0000 mg | ORAL_CAPSULE | Freq: Two times a day (BID) | ORAL | 0 refills | Status: DC | PRN

## 2018-10-29 NOTE — Telephone Encounter (Signed)
I will send a z pack and tessalone perles, she needs to clearly UNDERSTAND that if her symptoms worsen over  The next 3 to7 days , she will need cLINICAL evaluation, if work in available she gets that if not urgent care / eD

## 2018-10-29 NOTE — Telephone Encounter (Signed)
Advised patient of medications being sent in and if she worsens she will need to be seen per Dr.Simpson with verbal understanding.

## 2018-10-30 ENCOUNTER — Other Ambulatory Visit: Payer: Self-pay

## 2018-10-30 MED ORDER — AZITHROMYCIN 250 MG PO TABS: ORAL_TABLET | ORAL | 0 refills | Status: DC

## 2018-10-30 MED ORDER — BENZONATATE 100 MG PO CAPS: 100.0000 mg | ORAL_CAPSULE | Freq: Two times a day (BID) | ORAL | 0 refills | Status: DC | PRN

## 2018-10-30 NOTE — Telephone Encounter (Signed)
Pt called back stating she checked with pharmacy and they have no medications. Would like a return call at [phone].

## 2018-10-30 NOTE — Telephone Encounter (Signed)
Medications sent to [facility] per patient request

## 2019-06-25 ENCOUNTER — Encounter: Payer: Self-pay | Admitting: Family Medicine

## 2019-06-25 ENCOUNTER — Other Ambulatory Visit: Payer: Self-pay

## 2019-06-25 ENCOUNTER — Ambulatory Visit (INDEPENDENT_AMBULATORY_CARE_PROVIDER_SITE_OTHER): Payer: 59 | Admitting: Family Medicine

## 2019-06-25 VITALS — BP 128/82 | HR 72 | Temp 97.9°F | Resp 15 | Ht 64.0 in | Wt 161.0 lb

## 2019-06-25 DIAGNOSIS — R9431 Abnormal electrocardiogram [ECG] [EKG]: Secondary | ICD-10-CM | POA: Diagnosis not present

## 2019-06-25 DIAGNOSIS — E785 Hyperlipidemia, unspecified: Secondary | ICD-10-CM | POA: Diagnosis not present

## 2019-06-25 DIAGNOSIS — R079 Chest pain, unspecified: Secondary | ICD-10-CM | POA: Insufficient documentation

## 2019-06-25 DIAGNOSIS — F322 Major depressive disorder, single episode, severe without psychotic features: Secondary | ICD-10-CM

## 2019-06-25 DIAGNOSIS — E1165 Type 2 diabetes mellitus with hyperglycemia: Secondary | ICD-10-CM

## 2019-06-25 DIAGNOSIS — E1121 Type 2 diabetes mellitus with diabetic nephropathy: Secondary | ICD-10-CM | POA: Diagnosis not present

## 2019-06-25 DIAGNOSIS — IMO0002 Reserved for concepts with insufficient information to code with codable children: Secondary | ICD-10-CM

## 2019-06-25 MED ORDER — FLUOXETINE HCL 20 MG PO TABS: 20.0000 mg | ORAL_TABLET | Freq: Every day | ORAL | 3 refills | Status: DC

## 2019-06-25 NOTE — Addendum Note (Signed)
Addended by: Eual Fines on: 06/25/2019 11:34 AM   Modules accepted: Orders

## 2019-06-25 NOTE — Assessment & Plan Note (Signed)
Updated lab needed at/ before next visit. Pt needs cardiology eval for symptoms with abn eKG

## 2019-06-25 NOTE — Progress Notes (Signed)
   Michaela [name]     MRN: [id number]      DOB: Mar 22, 1956   HPI Michaela [name] is here for follow up and re-evaluation of chronic medical conditions, medication management and review of any available recent lab and radiology data.  Preventive health is updated, specifically  Cancer screening and Immunization.   Questions or concerns regarding consultations or procedures which the PT has had in the interim are  addressed. The PT denies any adverse reactions to current medications since the last visit.  2 week h/o intermittent chest pain when climbing stairs trigger is loss of pet Uncontrolled depression, [facility],  Has been on medication in the past and needs to resume, not actively suicidal or homicidal  ROS Denies recent fever or chills. Denies sinus pressure, nasal congestion, ear pain or sore throat. Denies chest congestion, productive cough or wheezing. Denies  palpitations and leg swelling Denies abdominal pain, nausea, vomiting,diarrhea or constipation.   Denies dysuria, frequency, hesitancy or incontinence. Denies joint pain, swelling and limitation in mobility. Denies headaches, seizures, numbness, or tingling.  Denies skin break down or rash.   PE  BP 128/82   Pulse 72   Temp 97.9 F (36.6 C) (Temporal)   Resp 15   Ht 5\' 4"  (1.626 m)   Wt 161 lb (73 kg)   SpO2 98%   BMI 27.64 kg/m   Patient alert and oriented and in no cardiopulmonary distress.  HEENT: No facial asymmetry, EOMI,   oropharynx pink and moist.  Neck supple no JVD, no mass.  Chest: Clear to auscultation bilaterally.  CVS: S1, S2 no murmurs, no S3.Regular rate. EKG: NSR, rate 64, t wave inversion in lateral leads ABD: Soft non tender.   Ext: No edema  MS: Adequate ROM spine, shoulders, hips and knees.  Skin: Intact, no ulcerations or rash noted.  Psych: Good eye contact, normal affect. Memory intact not anxious tearful and depressed appearing at times   CNS: CN 2-12 intact,  power,  normal throughout.no focal deficits noted.   Assessment & Plan  Chest pain on exertion 2 week h/o exertional chest pain, abn eKG suggestive of lateral ischenia in diabetic pt wih hyperlipidemia, refer  To cardiology and update labs  Hyperlipidemia LDL goal <100 Updated lab needed at/ before next visit. Pt needs cardiology eval for symptoms with abn eKG  Uncontrolled type 2 diabetes mellitus with nephropathy ([place]) Updated lab needed    Depression, major, single episode, severe ([place]) Not suicidal or homicidal ]Receiving psychotherapy through the [facility] Has been on SSRI in the past, will start fluoxetine 20 mg daily, and I recommend close f/u through [facility], will schedule 3 month f/u here also

## 2019-06-25 NOTE — Assessment & Plan Note (Signed)
Updated lab needed.  

## 2019-06-25 NOTE — Assessment & Plan Note (Signed)
2 week h/o exertional chest pain, abn eKG suggestive of lateral ischenia in diabetic pt wih hyperlipidemia, refer  To cardiology and update labs

## 2019-06-25 NOTE — Assessment & Plan Note (Signed)
Not suicidal or homicidal ]Receiving psychotherapy through the [facility] Has been on SSRI in the past, will start fluoxetine 20 mg daily, and I recommend close f/u through [facility], will schedule 3 month f/u here also

## 2019-06-25 NOTE — Patient Instructions (Addendum)
F/U in 3 months, call if you need me before  New for depression is fluoxetine 20 mg daily  I recommend t  you let your therapist and Doc know as soon as possible, that you are starting medication and use your therapist as much as able.  EKG In office today due to new chest pain, if abnormal I willl refer you to Cardiology in [place]  It is important that you exercise regularly at least 30 minutes 5 times a week. If you develop chest pain, have severe difficulty breathing, or feel very tired, stop exercising immediately and seek medical attention    Think about what you will eat, plan ahead. Choose " clean, green, fresh or frozen" over canned, processed or packaged foods which are more sugary, salty and fatty. 70 to 75% of food eaten should be vegetables and fruit. Three meals at set times with snacks allowed between meals, but they must be fruit or vegetables. Aim to eat over a 12 hour period , example 7 am to 7 pm, and STOP after  your last meal of the day. Drink water,generally about 64 ounces per day, no other drink is as healthy. Fruit juice is best enjoyed in a healthy way, by EATING the fruit.  Thanks for choosing [facility], we consider it a privelige to serve you.

## 2019-06-30 ENCOUNTER — Telehealth: Payer: Self-pay | Admitting: *Deleted

## 2019-06-30 NOTE — Telephone Encounter (Signed)
Pt called saying Dr.Simpson had ordered her blood work and she wanted to let her know she can only have this done at the [facility] so they will pay for it. She just wanted to let her know so that if she needed any blood work she could get all of it done while she was at the [facility].

## 2019-07-01 NOTE — Telephone Encounter (Signed)
I ordered the labs dr simpson wanted and let her know and she is supposed to be coming by to get the order to take to the [facility] [place]. By the way the message sounds she wasn't clear on this. Can you make sure she is coming to get the lab orders?

## 2019-07-02 NOTE — Telephone Encounter (Signed)
Spoke with Gr[place] she stated if the labs could please be faxed to the [place] to [place] at [phone] along with office notes and a copy of the EKG report and that when the lab results came back she would let Dr. Moshe Cipro know

## 2019-07-06 NOTE — Telephone Encounter (Signed)
Requested info faxed

## 2019-07-15 LAB — HEMOGLOBIN A1C: Hemoglobin A1C: 8.6

## 2019-07-15 LAB — LIPID PANEL: LDL Cholesterol: 64

## 2019-08-12 ENCOUNTER — Telehealth: Payer: Self-pay | Admitting: *Deleted

## 2019-08-12 NOTE — Telephone Encounter (Signed)
Spoke with patient and she stated she had an EKG done at this office and [facility] faxed over a lab order to the [facility] and she went and had them drawn. They were supposed to fax [place] the results. I have requested the last lab results from the [facility] [place]. Patient also stated [name] faxed copy of EKG and everything that was to be done to the [facility] [place] and nothing has been done. She just wants to know the results. She was supposed to be referred to cardiology but was waiting on the [facility] [place]. I told her I would request the labs and go from there.  [place] fax # [phone] [place] phone# [phone]

## 2019-08-12 NOTE — Telephone Encounter (Signed)
Pt called said she had an ekg done and was sent to the [place] for blood work but she has not heard the results from either. Would like a call back with results

## 2019-08-13 NOTE — Telephone Encounter (Signed)
FYI

## 2019-08-13 NOTE — Telephone Encounter (Signed)
Called to discuss results. No answer. Left generic message requesting a call back.

## 2019-08-13 NOTE — Telephone Encounter (Signed)
Blood work shows normal kidney and liver function and excellent cholesterol 9(report is scanned in record) Uncontrolled blood sugar, her HBA1C is 8.6, goal is 7 to 8 EKG, showed some abnormality, and she was referred to  Dr Harl Bowie ( local Cardiology) If she wants to go to Cardiology recommended by [facility] ( for insurance coverage , or other) she needs to let you know, what we need to do. Thanks

## 2019-08-14 NOTE — Telephone Encounter (Signed)
Called patient to discuss results. No answer. Left generic message requesting call back.

## 2019-08-17 NOTE — Telephone Encounter (Signed)
See referral to cardiology, Dr Harl Bowie, please refer, pt requests that we do this, give appt info if possible

## 2019-08-17 NOTE — Telephone Encounter (Signed)
Patient would like to be referred to Dr.Branch again please

## 2019-08-18 NOTE — Telephone Encounter (Signed)
Noted, thanks!

## 2019-08-18 NOTE — Telephone Encounter (Signed)
Spoke with cardiology. This was never scheduled as pt did not want to keep the appt. However I have set her up an appt for 09-21-19 at 9:20 in the [place] office with Dr Harl Bowie. Left a voicemail for pt to let her know appt details.

## 2019-09-14 ENCOUNTER — Other Ambulatory Visit: Payer: Self-pay

## 2019-09-14 ENCOUNTER — Ambulatory Visit (INDEPENDENT_AMBULATORY_CARE_PROVIDER_SITE_OTHER): Payer: 59

## 2019-09-14 DIAGNOSIS — Z23 Encounter for immunization: Secondary | ICD-10-CM

## 2019-09-21 ENCOUNTER — Ambulatory Visit: Payer: 59 | Admitting: Cardiology

## 2019-09-21 NOTE — Progress Notes (Deleted)
Clinical Summary Ms. Nasso is a 63 y.o.female seen as new consult, referred by Dr Moshe Cipro for chest pain.  1. Chest pain -  - CAD risk factors:  - EKG by pcp showed  Past Medical History:  Diagnosis Date  . Allergy   . Anemia   . Anxiety   . Blood transfusion without reported diagnosis   . Chronic kidney disease   . Colon polyps   . Depression   . Diabetes ([place])   . Dialysis patient [facility])    M/W/F  waiting on a kidney transplant  . Heart murmur   . Hyperlipidemia   . Hypertension   . 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 Sleep apnea      Allergies  Allergen Reactions  . Ibuprofen   . Sulfonamide Derivatives      Current Outpatient Medications  Medication Sig Dispense Refill  . aspirin 81 MG tablet Take 81 mg by mouth daily.    . ergocalciferol (VITAMIN D2) 50000 units capsule Take 50,000 Units by mouth once a week.    Marland Kitchen FLUoxetine (PROZAC) 20 MG tablet Take 1 tablet (20 mg total) by mouth daily. 30 tablet 3  . glipiZIDE (GLUCOTROL XL) 10 MG 24 hr tablet Take 10 mg by mouth 2 (two) times daily.     . insulin glargine (LANTUS) 100 UNIT/ML injection Inject 0.1 mLs (10 Units total) into the skin daily. 10 mL 11  . lisinopril (PRINIVIL,ZESTRIL) 10 MG tablet Take 1 tablet (10 mg total) by mouth daily. 90 tablet 1  . metFORMIN (GLUCOPHAGE) 500 MG tablet Take 500 mg by mouth 2 (two) times daily with a meal.    . metoprolol (TOPROL-XL) 100 MG 24 hr tablet Take 50 mg by mouth 2 (two) times daily.     . montelukast (SINGULAIR) 10 MG tablet Take 1 tablet (10 mg total) by mouth at bedtime. 90 tablet 1  . mycophenolate (MYFORTIC) 180 MG EC tablet Take 360 mg by mouth. 2 tabs in the am and 2 in the pm    . omeprazole (PRILOSEC) 20 MG capsule Take 20 mg by mouth daily.    . pravastatin (PRAVACHOL) 40 MG tablet Take 40 mg by mouth daily.    . predniSONE (DELTASONE) 5 MG tablet Take 5 mg by mouth daily.    . tacrolimus (PROGRAF) 1 MG capsule  Take 5 mg by mouth 2 (two) times daily.     Current Facility-Administered Medications  Medication Dose Route Frequency Provider Last Rate Last Dose  . 0.9 %  sodium chloride infusion  500 mL Intravenous Continuous Nandigam, Venia Minks, MD         Past Surgical History:  Procedure Laterality Date  . CESAREAN SECTION    . COLONOSCOPY    . DIALYSIS FISTULA CREATION    . 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 TUBAL LIGATION       Allergies  Allergen Reactions  . Ibuprofen   . Sulfonamide Derivatives       Family History  Problem Relation Age of Onset  . Diabetes Father   . Cancer Sister        uterine/vulva  . Diabetes Brother   . Kidney disease Maternal Grandmother   . Breast cancer Maternal Grandmother 34  . Pancreatic cancer Paternal Uncle   . Prostate cancer Cousin   . Breast cancer Cousin        ? age  of onset  . Breast cancer Maternal Aunt 58  . Colon polyps Maternal Aunt   . Colon polyps Cousin   . Colon polyps Paternal Aunt   . Colon cancer Neg Hx   . Esophageal cancer Neg Hx   . Rectal cancer Neg Hx   . Stomach cancer Neg Hx      Social History Ms. Difranco reports that she has never smoked. She has never used smokeless tobacco. Ms. Barten reports no history of alcohol use.   Review of Systems CONSTITUTIONAL: No weight loss, fever, chills, weakness or fatigue.  HEENT: Eyes: No visual loss, blurred vision, double vision or yellow sclerae.No hearing loss, sneezing, congestion, runny nose or sore throat.  SKIN: No rash or itching.  CARDIOVASCULAR:  RESPIRATORY: No shortness of breath, cough or sputum.  GASTROINTESTINAL: No anorexia, nausea, vomiting or diarrhea. No abdominal pain or blood.  GENITOURINARY: No burning on urination, no polyuria NEUROLOGICAL: No headache, dizziness, syncope, paralysis, ataxia, numbness or tingling in the extremities. No change in bowel or bladder control.  MUSCULOSKELETAL: No muscle, back pain, joint pain or  stiffness.  LYMPHATICS: No enlarged nodes. No history of splenectomy.  PSYCHIATRIC: No history of depression or anxiety.  ENDOCRINOLOGIC: No reports of sweating, cold or heat intolerance. No polyuria or polydipsia.  Marland Kitchen   Physical Examination There were no vitals filed for this visit. There were no vitals filed for this visit.  Gen: resting comfortably, no acute distress HEENT: no scleral icterus, pupils equal round and reactive, no palptable cervical adenopathy,  CV Resp: Clear to auscultation bilaterally GI: abdomen is soft, non-tender, non-distended, normal bowel sounds, no hepatosplenomegaly MSK: extremities are warm, no edema.  Skin: warm, no rash Neuro:  no focal deficits Psych: appropriate affect   Diagnostic Studies     Assessment and Plan        Arnoldo Lenis, M.D., F.A.C.C.

## 2019-09-30 ENCOUNTER — Ambulatory Visit: Payer: 59 | Admitting: Family Medicine

## 2019-10-06 ENCOUNTER — Ambulatory Visit: Payer: 59 | Admitting: Family Medicine

## 2019-10-21 ENCOUNTER — Other Ambulatory Visit: Payer: Self-pay

## 2019-10-21 ENCOUNTER — Ambulatory Visit (INDEPENDENT_AMBULATORY_CARE_PROVIDER_SITE_OTHER): Payer: 59 | Admitting: Family Medicine

## 2019-10-21 ENCOUNTER — Encounter: Payer: Self-pay | Admitting: Family Medicine

## 2019-10-21 VITALS — BP 122/74 | HR 67 | Temp 97.3°F | Resp 15 | Ht 64.0 in | Wt 161.0 lb

## 2019-10-21 DIAGNOSIS — E785 Hyperlipidemia, unspecified: Secondary | ICD-10-CM

## 2019-10-21 DIAGNOSIS — E663 Overweight: Secondary | ICD-10-CM

## 2019-10-21 DIAGNOSIS — Z780 Asymptomatic menopausal state: Secondary | ICD-10-CM | POA: Diagnosis not present

## 2019-10-21 DIAGNOSIS — I10 Essential (primary) hypertension: Secondary | ICD-10-CM | POA: Diagnosis not present

## 2019-10-21 DIAGNOSIS — E1121 Type 2 diabetes mellitus with diabetic nephropathy: Secondary | ICD-10-CM | POA: Diagnosis not present

## 2019-10-21 DIAGNOSIS — Z1231 Encounter for screening mammogram for malignant neoplasm of breast: Secondary | ICD-10-CM

## 2019-10-21 DIAGNOSIS — IMO0002 Reserved for concepts with insufficient information to code with codable children: Secondary | ICD-10-CM

## 2019-10-21 DIAGNOSIS — E1165 Type 2 diabetes mellitus with hyperglycemia: Secondary | ICD-10-CM

## 2019-10-21 DIAGNOSIS — F418 Other specified anxiety disorders: Secondary | ICD-10-CM

## 2019-10-21 NOTE — Patient Instructions (Addendum)
Annual physical exam  With  Pap in office in March 2021, call if you need me  Before  Pls schedule dexa   Please schedule mammogram in Feb when due  Thankful your blood sugar and mental health are improving  It is important that you exercise regularly at least 30 minutes 5 times a week. If you develop chest pain, have severe difficulty breathing, or feel very tired, stop exercising immediately and seek medical attention  Thanks for choosing [facility], we consider it a privelige to serve you.   Please get your eye exam end December when scheduled

## 2019-10-21 NOTE — Progress Notes (Signed)
AIRIAL [name]     MRN: [id number]      DOB: May 02, 1956   HPI [name] is here for follow up and re-evaluation of chronic medical conditions, medication management and review of any available recent lab and radiology data.  Preventive health is updated, specifically  Cancer screening and Immunization.   Questions or concerns regarding consultations or procedures which the PT has had in the interim are  addressed. The PT denies any adverse reactions to current medications since the last visit.  There are no new concerns.  There are no specific complaints  Denies polyuria, polydipsia, blurred vision , or hypoglycemic episodes. Reports im[proved blood sugar with diet and medication adherence  ROS Denies recent fever or chills. Denies sinus pressure, nasal congestion, ear pain or sore throat. Denies chest congestion, productive cough or wheezing. Denies chest pains, palpitations and leg swelling Denies abdominal pain, nausea, vomiting,diarrhea or constipation.   Denies dysuria, frequency, hesitancy or incontinence. Denies joint pain, swelling and limitation in mobility. Denies headaches, seizures, numbness, or tingling. Denies uncontrolled depression, anxiety or insomnia. Denies skin break down or rash.   PE  BP 122/74   Pulse 67   Temp (!) 97.3 F (36.3 C) (Temporal)   Resp 15   Ht 5\' 4"  (1.626 m)   Wt 161 lb (73 kg)   SpO2 98%   BMI 27.64 kg/m   Patient alert and oriented and in no cardiopulmonary distress.  HEENT: No facial asymmetry, EOMI,     Neck supple .  Chest: Clear to auscultation bilaterally.  CVS: S1, S2 no murmurs, no S3.Regular rate.  ABD: Soft non tender.   Ext: No edema  MS: Adequate ROM spine, shoulders, hips and knees.  Skin: Intact, no ulcerations or rash noted.  Psych: Good eye contact, normal affect. Memory intact not anxious or depressed appearing.  CNS: CN 2-12 intact, power,  normal throughout.no focal deficits noted.   Assessment &  Plan  Essential hypertension Controlled, no change in medication DASH diet and commitment to daily physical activity for a minimum of 30 minutes discussed and encouraged, as a part of hypertension management. The importance of attaining a healthy weight is also discussed.  BP/Weight 10/21/2019 06/25/2019 05/06/2018 03/25/2017 03/12/2017 02/26/2017 [id number]  Systolic BP 123XX123 0000000 [id number] 123456 0000000 - [id number]  Diastolic BP 74 82 78 70 77 - 68  Wt. (Lbs) 161 161 162 164 170 165.2 170.4  BMI 27.64 27.64 27.81 28.15 29.18 28.36 29.25       Depression with anxiety Score of 9 , not suicidal or homicidal, improved, manged by Psych and receives therapy which she reports much benefit from  Overweight (BMI 25.0-29.9)  Patient re-educated about  the importance of commitment to a  minimum of 150 minutes of exercise per week as able.  The importance of healthy food choices with portion control discussed, as well as eating regularly and within a 12 hour window most days. The need to choose "clean , green" food 50 to 75% of the time is discussed, as well as to make water the primary drink and set a goal of 64 ounces water daily.    Weight /BMI 10/21/2019 06/25/2019 05/06/2018  WEIGHT 161 lb 161 lb 162 lb  HEIGHT 5\' 4"  5\' 4"  5\' 4"   BMI 27.64 kg/m2 27.64 kg/m2 27.81 kg/m2      Uncontrolled type 2 diabetes mellitus with nephropathy ([place]) Michaela [name] is reminded of the importance of commitment to daily physical activity for 30 minutes  or more, as able and the need to limit carbohydrate intake to 30 to 60 grams per meal to help with blood sugar control.   The need to take medication as prescribed, test blood sugar as directed, and to call between visits if there is a concern that blood sugar is uncontrolled is also discussed.   Michaela [name] is reminded of the importance of daily foot exam, annual eye examination, and good blood sugar, blood pressure and cholesterol control.  Diabetic Labs Latest Ref Rng & Units  07/15/2019 07/20/2014 06/10/2014 02/03/2014 10/06/2013  HbA1c - 8.6 - 7.2 - -  Microalbumin 0.00 - 1.89 mg/dL - 0.94 - - -  Micro/Creat Ratio 0.0 - 30.0 mg/g - 4.2 - - -  Chol 0 - 200 mg/dL - - - 265(H) 214(H)  HDL >39 mg/dL - - - 44 36(L)  Calc LDL - 64 - 157 180(H) 117(H)  Triglycerides <150 mg/dL - - - 204(H) 304(H)  Creatinine 0.50 - 1.10 mg/dL - - - 1.00 -   BP/Weight 10/21/2019 06/25/2019 05/06/2018 03/25/2017 03/12/2017 02/26/2017 [id number]  Systolic BP 123XX123 0000000 [id number] 123456 0000000 - [id number]  Diastolic BP 74 82 78 70 77 - 68  Wt. (Lbs) 161 161 162 164 170 165.2 170.4  BMI 27.64 27.64 27.81 28.15 29.18 28.36 29.25   Foot/eye exam completion dates 06/25/2019 03/25/2017  Foot Form Completion Done Done  uncontrolled , however , reports improved on current regime will follow [facility]     Hyperlipidemia LDL goal <100 Hyperlipidemia:Low fat diet discussed and encouraged.  Will review most recent lab

## 2019-11-01 ENCOUNTER — Encounter: Payer: Self-pay | Admitting: Family Medicine

## 2019-11-01 NOTE — Assessment & Plan Note (Signed)
Hyperlipidemia:Low fat diet discussed and encouraged.  Will review most recent lab

## 2019-11-01 NOTE — Assessment & Plan Note (Signed)
Score of 9 , not suicidal or homicidal, improved, manged by Psych and receives therapy which she reports much benefit from

## 2019-11-01 NOTE — Assessment & Plan Note (Signed)
  Patient re-educated about  the importance of commitment to a  minimum of 150 minutes of exercise per week as able.  The importance of healthy food choices with portion control discussed, as well as eating regularly and within a 12 hour window most days. The need to choose "clean , green" food 50 to 75% of the time is discussed, as well as to make water the primary drink and set a goal of 64 ounces water daily.    Weight /BMI 10/21/2019 06/25/2019 05/06/2018  WEIGHT 161 lb 161 lb 162 lb  HEIGHT 5\' 4"  5\' 4"  5\' 4"   BMI 27.64 kg/m2 27.64 kg/m2 27.81 kg/m2

## 2019-11-01 NOTE — Assessment & Plan Note (Signed)
Controlled, no change in medication DASH diet and commitment to daily physical activity for a minimum of 30 minutes discussed and encouraged, as a part of hypertension management. The importance of attaining a healthy weight is also discussed.  BP/Weight 10/21/2019 06/25/2019 05/06/2018 03/25/2017 03/12/2017 02/26/2017 [id number]  Systolic BP 123XX123 0000000 [id number] 123456 0000000 - [id number]  Diastolic BP 74 82 78 70 77 - 68  Wt. (Lbs) 161 161 162 164 170 165.2 170.4  BMI 27.64 27.64 27.81 28.15 29.18 28.36 29.25

## 2019-11-01 NOTE — Assessment & Plan Note (Signed)
Michaela [name] is reminded of the importance of commitment to daily physical activity for 30 minutes or more, as able and the need to limit carbohydrate intake to 30 to 60 grams per meal to help with blood sugar control.   The need to take medication as prescribed, test blood sugar as directed, and to call between visits if there is a concern that blood sugar is uncontrolled is also discussed.   Michaela [name] is reminded of the importance of daily foot exam, annual eye examination, and good blood sugar, blood pressure and cholesterol control.  Diabetic Labs Latest Ref Rng & Units 07/15/2019 07/20/2014 06/10/2014 02/03/2014 10/06/2013  HbA1c - 8.6 - 7.2 - -  Microalbumin 0.00 - 1.89 mg/dL - 0.94 - - -  Micro/Creat Ratio 0.0 - 30.0 mg/g - 4.2 - - -  Chol 0 - 200 mg/dL - - - 265(H) 214(H)  HDL >39 mg/dL - - - 44 36(L)  Calc LDL - 64 - 157 180(H) 117(H)  Triglycerides <150 mg/dL - - - 204(H) 304(H)  Creatinine 0.50 - 1.10 mg/dL - - - 1.00 -   BP/Weight 10/21/2019 06/25/2019 05/06/2018 03/25/2017 03/12/2017 02/26/2017 [id number]  Systolic BP 123XX123 0000000 [id number] 123456 0000000 - 99991111  Diastolic BP 74 82 78 70 77 - 68  Wt. (Lbs) 161 161 162 164 170 165.2 170.4  BMI 27.64 27.64 27.81 28.15 29.18 28.36 29.25   Foot/eye exam completion dates 06/25/2019 03/25/2017  Foot Form Completion Done Done  uncontrolled , however , reports improved on current regime will follow updated HBA1C, treated through the [place]

## 2020-01-18 ENCOUNTER — Other Ambulatory Visit: Payer: 59

## 2020-01-18 ENCOUNTER — Other Ambulatory Visit: Payer: Self-pay

## 2020-01-18 ENCOUNTER — Ambulatory Visit
Admission: RE | Admit: 2020-01-18 | Discharge: 2020-01-18 | Disposition: A | Discharge status: Home / Self Care | Payer: 59 | Source: Ambulatory Visit | Attending: Family Medicine | Admitting: Family Medicine

## 2020-01-18 DIAGNOSIS — Z1231 Encounter for screening mammogram for malignant neoplasm of breast: Secondary | ICD-10-CM

## 2020-02-04 ENCOUNTER — Ambulatory Visit: Payer: Self-pay | Admitting: Podiatry

## 2020-02-16 ENCOUNTER — Encounter: Payer: 59 | Admitting: Family Medicine

## 2020-02-22 ENCOUNTER — Ambulatory Visit (INDEPENDENT_AMBULATORY_CARE_PROVIDER_SITE_OTHER): Payer: No Typology Code available for payment source | Admitting: Podiatry

## 2020-02-22 ENCOUNTER — Other Ambulatory Visit: Payer: Self-pay

## 2020-02-22 ENCOUNTER — Encounter: Payer: Self-pay | Admitting: Podiatry

## 2020-02-22 VITALS — Temp 97.5°F

## 2020-02-22 DIAGNOSIS — E1165 Type 2 diabetes mellitus with hyperglycemia: Secondary | ICD-10-CM | POA: Diagnosis not present

## 2020-02-22 DIAGNOSIS — L600 Ingrowing nail: Secondary | ICD-10-CM | POA: Diagnosis not present

## 2020-02-22 DIAGNOSIS — M79675 Pain in left toe(s): Secondary | ICD-10-CM | POA: Diagnosis not present

## 2020-02-22 DIAGNOSIS — M79674 Pain in right toe(s): Secondary | ICD-10-CM | POA: Diagnosis not present

## 2020-02-22 DIAGNOSIS — IMO0002 Reserved for concepts with insufficient information to code with codable children: Secondary | ICD-10-CM

## 2020-02-22 DIAGNOSIS — E1121 Type 2 diabetes mellitus with diabetic nephropathy: Secondary | ICD-10-CM | POA: Diagnosis not present

## 2020-02-22 DIAGNOSIS — B351 Tinea unguium: Secondary | ICD-10-CM | POA: Diagnosis not present

## 2020-02-23 ENCOUNTER — Telehealth: Payer: Self-pay

## 2020-02-23 NOTE — Telephone Encounter (Signed)
Dr. Jacqualyn Posey,  This patient has authorization for Unlimited Visits, Valid 12/15/2019-08/02/2020 from the [place]. [facility] was [facility] for an ingrown toe nail procedure. Nail avulsion should be covered under the diagnosis.

## 2020-02-23 NOTE — Telephone Encounter (Signed)
Thanks, please let her know and we can schedule her at her convience.

## 2020-02-23 NOTE — Progress Notes (Signed)
Subjective:   Patient ID: Michaela [name], female   DOB: 64 y.o.   MRN: [id number]   HPI 57-year-old female presents the office today for concerns of her nails becoming very thick and elongated that she cannot trim her self.  She is also getting ingrown toenail on the left big toe.  She has some drainage coming out previously but currently denies any drainage.  Denies any drainage or pus coming from the area.  She is diabetic and she states her last A1c is around 8.  She denies any claudication symptoms.   Review of Systems  All other systems reviewed and are negative.  Past Medical History:  Diagnosis Date  . Allergy   . Anemia   . Anxiety   . Blood transfusion without reported diagnosis   . Chronic kidney disease   . Colon polyps   . Depression   . Diabetes ([place])   . Dialysis patient [facility])    M/W/F  waiting on a kidney transplant  . Heart murmur   . Hyperlipidemia   . Hypertension   . 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 Sleep apnea     Past Surgical History:  Procedure Laterality Date  . CESAREAN SECTION    . COLONOSCOPY    . DIALYSIS FISTULA CREATION    . 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 TUBAL LIGATION       Current Outpatient Medications:  .  Alcohol Swabs (CURITY ALCOHOL PREPS) 70 % PADS, , Disp: , Rfl:  .  aspirin 81 MG tablet, Take 81 mg by mouth daily., Disp: , Rfl:  .  atorvastatin (LIPITOR) 40 MG tablet, , Disp: , Rfl:  .  calcitRIOL (ROCALTROL) 0.25 MCG capsule, Take 0.25 mcg by mouth daily., Disp: , Rfl:  .  cetirizine (ZYRTEC) 10 MG tablet, Take by mouth., Disp: , Rfl:  .  ergocalciferol (VITAMIN D2) 50000 units capsule, Take 50,000 Units by mouth once a week., Disp: , Rfl:  .  escitalopram (LEXAPRO) 20 MG tablet, Take 20 mg by mouth daily., Disp: , Rfl:  .  fluconazole (DIFLUCAN) 200 MG tablet, Take 200 mg by mouth daily., Disp: , Rfl:  .  FLUoxetine (PROZAC) 20 MG tablet, Take 1 tablet (20 mg total) by  mouth daily., Disp: 30 tablet, Rfl: 3 .  glipiZIDE (GLUCOTROL XL) 10 MG 24 hr tablet, Take 10 mg by mouth 2 (two) times daily. , Disp: , Rfl:  .  insulin glargine (LANTUS) 100 UNIT/ML injection, Inject 0.1 mLs (10 Units total) into the skin daily., Disp: 10 mL, Rfl: 11 .  lisinopril (PRINIVIL,ZESTRIL) 10 MG tablet, Take 1 tablet (10 mg total) by mouth daily., Disp: 90 tablet, Rfl: 1 .  metFORMIN (GLUCOPHAGE) 500 MG tablet, Take 500 mg by mouth 2 (two) times daily with a meal., Disp: , Rfl:  .  metoprolol (TOPROL-XL) 100 MG 24 hr tablet, Take 50 mg by mouth 2 (two) times daily. , Disp: , Rfl:  .  metoprolol tartrate (LOPRESSOR) 100 MG tablet, Take 100 mg by mouth 2 (two) times daily., Disp: , Rfl:  .  montelukast (SINGULAIR) 10 MG tablet, Take 1 tablet (10 mg total) by mouth at bedtime., Disp: 90 tablet, Rfl: 1 .  mupirocin ointment (BACTROBAN) 2 %, Apply 1 application topically 3 (three) times daily., Disp: , Rfl:  .  mycophenolate (MYFORTIC) 180 MG EC tablet, Take 360 mg by mouth. 2 tabs in the  am and 2 in the pm, Disp: , Rfl:  .  omeprazole (PRILOSEC) 20 MG capsule, Take 20 mg by mouth daily., Disp: , Rfl:  .  pantoprazole (PROTONIX) 40 MG tablet, Take 40 mg by mouth daily., Disp: , Rfl:  .  pravastatin (PRAVACHOL) 40 MG tablet, Take 40 mg by mouth daily., Disp: , Rfl:  .  predniSONE (DELTASONE) 5 MG tablet, Take 5 mg by mouth daily., Disp: , Rfl:  .  tacrolimus (PROGRAF) 1 MG capsule, Take 5 mg by mouth 2 (two) times daily., Disp: , Rfl:  .  TechLite Lancets MISC, , Disp: , Rfl:  .  valGANciclovir (VALCYTE) 450 MG tablet, Take by mouth., Disp: , Rfl:   Current Facility-Administered Medications:  .  0.9 %  sodium chloride infusion, 500 mL, Intravenous, Continuous, Nandigam, Venia Minks, MD  Allergies  Allergen Reactions  . Ibuprofen   . Sulfonamide Derivatives           Objective:  Physical Exam  General: AAO x3, NAD  Dermatological: Nails are hypertrophic, dystrophic, brittle,  discolored, elongated 10.  Incurvation present to left hallux toenail.  Upon debridement there is no drainage or pus.  There is no edema, erythema or any clinical signs of infection.  No surrounding redness or drainage. Tenderness nails 1-5 bilaterally. No open lesions or pre-ulcerative lesions are identified today.  Vascular: Dorsalis Pedis artery and Posterior Tibial artery pedal pulses are 2/4 bilateral with immedate capillary fill time. Pedal hair growth present. No varicosities and no lower extremity edema present bilateral. There is no pain with calf compression, swelling, warmth, erythema.   Neruologic: Grossly intact via light touch bilateral.  Sensation intact with Semmes Weinstein monofilament.  Musculoskeletal: No gross boney pedal deformities bilateral. No pain, crepitus, or limitation noted with foot and ankle range of motion bilateral. Muscular strength 5/5 in all groups tested bilateral.  Gait: Unassisted, Nonantalgic.      Assessment:   Symptomatic onychomycosis, left hallux ingrown toenail     Plan:  -Treatment options discussed including all alternatives, risks, and complications -Etiology of symptoms were discussed -Debrided nails x10 without any complications or bleeding.  Discussed with her partial nail avulsion of the left hallux toenail but she wants to hold off on this and she was to check with the [place] for authorization prior to this. -Discussed importance daily foot inspection.  Trula Slade DPM

## 2020-04-11 ENCOUNTER — Ambulatory Visit: Payer: 59 | Admitting: Podiatry

## 2020-04-11 ENCOUNTER — Telehealth: Payer: Self-pay | Admitting: *Deleted

## 2020-04-11 NOTE — Telephone Encounter (Signed)
That is OK. I have not entered any charges for today.

## 2020-04-11 NOTE — Telephone Encounter (Signed)
Pt states her doctor says her A1C is 10 and she doesn't want to have the ingrown toenail procedure today. I told pt to get her treatment plan from her PCP and I would inform Dr. Jacqualyn Posey.

## 2020-05-05 LAB — HM DIABETES EYE EXAM

## 2020-07-06 ENCOUNTER — Ambulatory Visit (INDEPENDENT_AMBULATORY_CARE_PROVIDER_SITE_OTHER): Payer: No Typology Code available for payment source | Admitting: Family Medicine

## 2020-07-06 ENCOUNTER — Encounter: Payer: Self-pay | Admitting: Family Medicine

## 2020-07-06 ENCOUNTER — Other Ambulatory Visit (HOSPITAL_COMMUNITY)
Admission: RE | Admit: 2020-07-06 | Discharge: 2020-07-06 | Disposition: A | Discharge status: Home / Self Care | Payer: No Typology Code available for payment source | Source: Ambulatory Visit | Attending: Family Medicine | Admitting: Family Medicine

## 2020-07-06 ENCOUNTER — Other Ambulatory Visit: Payer: Self-pay

## 2020-07-06 VITALS — BP 148/71 | HR 64 | Resp 16 | Ht 64.0 in | Wt 156.0 lb

## 2020-07-06 DIAGNOSIS — IMO0002 Reserved for concepts with insufficient information to code with codable children: Secondary | ICD-10-CM

## 2020-07-06 DIAGNOSIS — E1121 Type 2 diabetes mellitus with diabetic nephropathy: Secondary | ICD-10-CM

## 2020-07-06 DIAGNOSIS — Z124 Encounter for screening for malignant neoplasm of cervix: Secondary | ICD-10-CM

## 2020-07-06 DIAGNOSIS — Z0000 Encounter for general adult medical examination without abnormal findings: Secondary | ICD-10-CM | POA: Diagnosis not present

## 2020-07-06 DIAGNOSIS — E1165 Type 2 diabetes mellitus with hyperglycemia: Secondary | ICD-10-CM

## 2020-07-06 DIAGNOSIS — I10 Essential (primary) hypertension: Secondary | ICD-10-CM | POA: Diagnosis not present

## 2020-07-06 LAB — POCT GLYCOSYLATED HEMOGLOBIN (HGB A1C)
HbA1c POC (<> result, manual entry): 8.4 % (ref 4.0–5.6)
HbA1c, POC (controlled diabetic range): 8.4 % — AB (ref 0.0–7.0)
HbA1c, POC (prediabetic range): 8.4 % — AB (ref 5.7–6.4)
Hemoglobin A1C: 8.4 % — AB (ref 4.0–5.6)

## 2020-07-06 MED ORDER — LISINOPRIL 20 MG PO TABS: 20.0000 mg | ORAL_TABLET | Freq: Every day | ORAL | 3 refills | Status: DC

## 2020-07-06 NOTE — Patient Instructions (Addendum)
Follow-up in office to reevaluate blood pressure with MD in the next 4 to 6 weeks.  Call if you need me sooner.  Blood pressure is high dose of lisinopril is increased from 10 mg daily to 20 mg once daily please fill new prescription at your pharmacy. [facility] today Please bring all medications that you are taking to the next visit.  You are referred to Dr. Dorris Fetch the endocrinologist to manage your diabetes.  You are referred to the diabetic educator P Crum[place] to help with diabetes management.  You are referred to podiatry for foot care being an uncontrolled diabetic with long nails and calluses. Cmp and EGFR today We will try to retrieve labs from the [place] before having you draw new labs, pls try and obtain these It is important that you exercise regularly at least 30 minutes 5 times a week. If you develop chest pain, have severe difficulty breathing, or feel very tired, stop exercising immediately and seek medical attention  Think about what you will eat, plan ahead. Choose " clean, green, fresh or frozen" over canned, processed or packaged foods which are more sugary, salty and fatty. 70 to 75% of food eaten should be vegetables and fruit. Three meals at set times with snacks allowed between meals, but they must be fruit or vegetables. Aim to eat over a 12 hour period , example 7 am to 7 pm, and STOP after  your last meal of the day. Drink water,generally about 64 ounces per day, no other drink is as healthy. Fruit juice is best enjoyed in a healthy way, by EATING the fruit.

## 2020-07-06 NOTE — Progress Notes (Signed)
LAKIN RHINE     MRN: [id number]      DOB: 12/08/1955  HPI: Patient is in for annual physical exam. No other health concerns are expressed or addressed at the visit. Recent labs, if available are reviewed. Immunization is reviewed , and  updated if needed.   PE: BP (!) 148/71   Pulse 64   Resp 16   Ht 5\' 4"  (1.626 m)   Wt 156 lb (70.8 kg)   SpO2 99%   BMI 26.78 kg/m   Pleasant  female, alert and oriented x 3, in no cardio-pulmonary distress. Afebrile. HEENT No facial trauma or asymetry. Sinuses non tender.  Extra occullar muscles intact.. External ears normal, . Neck: supple, no adenopathy,JVD or thyromegaly.No bruits.  Chest: Clear to ascultation bilaterally.No crackles or wheezes. Non tender to palpation  Breast: No asymetry,no masses or lumps. No tenderness. No nipple discharge or inversion. No axillary or supraclavicular adenopathy  Cardiovascular system; Heart sounds normal,  S1 and  S2 ,no S3.  No murmur, or thrill. Apical beat not displaced Peripheral pulses normal.  Abdomen: Soft, non tender, no organomegaly or masses. No bruits. Bowel sounds normal. No guarding, tenderness or rebound.   GU: External genitalia normal female genitalia , normal female distribution of hair. No lesions. Urethral meatus normal in size, no  Prolapse, no lesions visibly  Present. Bladder non tender. Vagina pink and moist , with no visible lesions , discharge present . Adequate pelvic support no  cystocele or rectocele noted Cervix pink and appears healthy, no lesions or ulcerations noted, no discharge noted from os Uterus normal size, no adnexal masses, no cervical motion or adnexal tenderness.   Musculoskeletal exam: Full ROM of spine, hips , shoulders and knees. No deformity ,swelling or crepitus noted. No muscle wasting or atrophy.   Neurologic: Cranial nerves 2 to 12 intact. Power, tone ,sensation and reflexes normal throughout. No disturbance in gait. No  tremor.  Skin: Intact, no ulceration, erythema , scaling or rash noted. Pigmentation normal throughout  Psych; Normal mood and affect. Judgement and concentration normal   Assessment & Plan:  Essential hypertension Uncontrolled , incrase lisinopril dose and re eval in 8 weeks DASH diet and commitment to daily physical activity for a minimum of 30 minutes discussed and encouraged, as a part of hypertension management. The importance of attaining a healthy weight is also discussed.  BP/Weight 07/06/2020 10/21/2019 06/25/2019 05/06/2018 03/25/2017 4/00/8676 12/05/5091  Systolic BP 267 124 580 998 338 250 -  Diastolic BP 71 74 82 78 70 77 -  Wt. (Lbs) 156 161 161 162 164 170 165.2  BMI 26.78 27.64 27.64 27.81 28.15 29.18 28.36       Uncontrolled type 2 diabetes mellitus with nephropathy ([facility]) Ms. Granados is reminded of the importance of commitment to daily physical activity for 30 minutes or more, as able and the need to limit carbohydrate intake to 30 to 60 grams per meal to help with blood sugar control.   The need to take medication as prescribed, test blood sugar as directed, and to call between visits if there is a concern that blood sugar is uncontrolled is also discussed.   Ms. Hayne is reminded of the importance of daily foot exam, annual eye examination, and good blood sugar, blood pressure and cholesterol control.  Diabetic Labs Latest Ref Rng & Units 07/06/2020 07/06/2020 07/06/2020 07/06/2020 07/15/2019  HbA1c 4.0 - 5.6 % 8.4(A) 8.4(A) 8.4 8.4(A) 8.6  Microalbumin 0.00 - 1.89 mg/dL - - - - -  Micro/Creat Ratio 0.0 - 30.0 mg/g - - - - -  Chol 0 - 200 mg/dL - - - - -  HDL >39 mg/dL - - - - -  Calc LDL - - - - - 64  Triglycerides <150 mg/dL - - - - -  Creatinine 0.50 - 1.10 mg/dL - - - - -   BP/Weight 07/06/2020 10/21/2019 06/25/2019 05/06/2018 03/25/2017 6/38/1771 12/01/5788  Systolic BP 383 338 329 191 660 600 -  Diastolic BP 71 74 82 78 70 77 -  Wt. (Lbs) 156 161 161 162 164 170  165.2  BMI 26.78 27.64 27.64 27.81 28.15 29.18 28.36   Foot/eye exam completion dates 07/06/2020 06/25/2019  Foot Form Completion Done Done   Refer to endo and diabetic ed

## 2020-07-07 ENCOUNTER — Encounter: Payer: Self-pay | Admitting: Family Medicine

## 2020-07-07 DIAGNOSIS — Z0000 Encounter for general adult medical examination without abnormal findings: Secondary | ICD-10-CM | POA: Insufficient documentation

## 2020-07-07 LAB — CYTOLOGY - PAP
Comment: NEGATIVE
Diagnosis: NEGATIVE
High risk HPV: NEGATIVE

## 2020-07-07 NOTE — Assessment & Plan Note (Signed)
Uncontrolled , incrase lisinopril dose and re eval in 8 weeks DASH diet and commitment to daily physical activity for a minimum of 30 minutes discussed and encouraged, as a part of hypertension management. The importance of attaining a healthy weight is also discussed.  BP/Weight 07/06/2020 10/21/2019 06/25/2019 05/06/2018 03/25/2017 4/73/9584 02/24/7126  Systolic BP 871 836 725 500 164 290 -  Diastolic BP 71 74 82 78 70 77 -  Wt. (Lbs) 156 161 161 162 164 170 165.2  BMI 26.78 27.64 27.64 27.81 28.15 29.18 28.36

## 2020-07-07 NOTE — Assessment & Plan Note (Signed)
Michaela [name] is reminded of the importance of commitment to daily physical activity for 30 minutes or more, as able and the need to limit carbohydrate intake to 30 to 60 grams per meal to help with blood sugar control.   The need to take medication as prescribed, test blood sugar as directed, and to call between visits if there is a concern that blood sugar is uncontrolled is also discussed.   Michaela [name] is reminded of the importance of daily foot exam, annual eye examination, and good blood sugar, blood pressure and cholesterol control.  Diabetic Labs Latest Ref Rng & Units 07/06/2020 07/06/2020 07/06/2020 07/06/2020 07/15/2019  HbA1c 4.0 - 5.6 % 8.4(A) 8.4(A) 8.4 8.4(A) 8.6  Microalbumin 0.00 - 1.89 mg/dL - - - - -  Micro/Creat Ratio 0.0 - 30.0 mg/g - - - - -  Chol 0 - 200 mg/dL - - - - -  HDL >39 mg/dL - - - - -  Calc LDL - - - - - 64  Triglycerides <150 mg/dL - - - - -  Creatinine 0.50 - 1.10 mg/dL - - - - -   BP/Weight 07/06/2020 10/21/2019 06/25/2019 05/06/2018 03/25/2017 1/50/4136 02/27/8376  Systolic BP 939 688 648 472 072 182 -  Diastolic BP 71 74 82 78 70 77 -  Wt. (Lbs) 156 161 161 162 164 170 165.2  BMI 26.78 27.64 27.64 27.81 28.15 29.18 28.36   Foot/eye exam completion dates 07/06/2020 06/25/2019  Foot Form Completion Done Done   Refer to endo and diabetic ed

## 2020-07-08 ENCOUNTER — Telehealth: Payer: Self-pay

## 2020-07-08 NOTE — Telephone Encounter (Signed)
Patient aware of results.

## 2020-07-08 NOTE — Telephone Encounter (Signed)
Pt calling for lab results.

## 2020-07-20 ENCOUNTER — Encounter: Payer: Self-pay | Admitting: Family Medicine

## 2020-07-20 ENCOUNTER — Other Ambulatory Visit: Payer: Self-pay

## 2020-07-20 ENCOUNTER — Ambulatory Visit (INDEPENDENT_AMBULATORY_CARE_PROVIDER_SITE_OTHER): Payer: Non-veteran care | Admitting: Family Medicine

## 2020-07-20 VITALS — BP 131/72 | HR 81 | Resp 16 | Ht 64.0 in | Wt 158.1 lb

## 2020-07-20 DIAGNOSIS — R1012 Left upper quadrant pain: Secondary | ICD-10-CM | POA: Diagnosis not present

## 2020-07-20 DIAGNOSIS — Z23 Encounter for immunization: Secondary | ICD-10-CM

## 2020-07-20 DIAGNOSIS — E1165 Type 2 diabetes mellitus with hyperglycemia: Secondary | ICD-10-CM

## 2020-07-20 DIAGNOSIS — IMO0002 Reserved for concepts with insufficient information to code with codable children: Secondary | ICD-10-CM

## 2020-07-20 DIAGNOSIS — E1121 Type 2 diabetes mellitus with diabetic nephropathy: Secondary | ICD-10-CM

## 2020-07-20 DIAGNOSIS — G8929 Other chronic pain: Secondary | ICD-10-CM | POA: Insufficient documentation

## 2020-07-20 DIAGNOSIS — N3000 Acute cystitis without hematuria: Secondary | ICD-10-CM

## 2020-07-20 DIAGNOSIS — R109 Unspecified abdominal pain: Secondary | ICD-10-CM | POA: Diagnosis not present

## 2020-07-20 DIAGNOSIS — I10 Essential (primary) hypertension: Secondary | ICD-10-CM

## 2020-07-20 HISTORY — DX: Left upper quadrant pain: R10.12

## 2020-07-20 LAB — POCT URINALYSIS DIP (CLINITEK)
Bilirubin, UA: NEGATIVE
Blood, UA: NEGATIVE
Glucose, UA: NEGATIVE mg/dL
Ketones, POC UA: NEGATIVE mg/dL
Nitrite, UA: NEGATIVE
POC PROTEIN,UA: NEGATIVE
Spec Grav, UA: 1.025 (ref 1.010–1.025)
Urobilinogen, UA: 0.2 E.U./dL
pH, UA: 5.5 (ref 5.0–8.0)

## 2020-07-20 MED ORDER — CEPHALEXIN 500 MG PO CAPS
500.0000 mg | ORAL_CAPSULE | Freq: Two times a day (BID) | ORAL | 0 refills | Status: DC
Start: 2020-07-20 — End: 2020-11-28

## 2020-07-20 NOTE — Assessment & Plan Note (Signed)
3 week h/o LUQ pain, needs DG abd

## 2020-07-20 NOTE — Patient Instructions (Signed)
F/u as before, call if you need me sooner  Flu vaccine today  5 day course of keflex prescribed for presumed UTI, we will let you know results , calling home number  Please get Xray of your abdomen and chest today, this is ordered  You will be contacted with appt info on [place] of left kidney/ flank ( home tele #)  Hope you improve soon  Thanks for choosing [facility], we consider it a privelige to serve you.

## 2020-07-20 NOTE — Progress Notes (Signed)
° °  Michaela [name]     MRN: [id number]      DOB: 15-Jan-1956   HPI [name] is here with left flank pain x 2 weeks. Worse today, rated at an 8 with bending forward Urine noted to be cloudy and states had similarsymptoms years ago when she had kidney infection no recent fever or chlls, no blood in urine or h/o kidney stones Denies polyuria, polydipsia, blurred vision , or hypoglycemic episodes.   ROS Denies recent fever or chills. Denies sinus pressure, nasal congestion, ear pain or sore throat. Denies chest congestion, productive cough or wheezing. Denies chest pains, palpitations and leg swelling Denies l pain, nausea, vomiting,diarrhea or constipation.   Denies dysuria, frequency, hesitancy or incontinence. Denies joint pain, swelling and limitation in mobility. Denies headaches, seizures, numbness, or tingling. Denies depression, anxiety or insomnia. Denies skin break down or rash.   PE  BP 131/72    Pulse 81    Resp 16    Ht 5\' 4"  (1.626 m)    Wt 158 lb 1.9 oz (71.7 kg)    SpO2 98%    BMI 27.14 kg/m   Patient alert and oriented and in no cardiopulmonary distress.  HEENT: No facial asymmetry, EOMI,     Neck supple .  Chest: Clear to auscultation bilaterally.  CVS: S1, S2 no murmurs, no S3.Regular rate.  ABD: Soft tendr over left flank, no rebound tender over LUQ, normal BS  Ext: No edema  MS: Adequate ROM spine, shoulders, hips and knees.  Skin: Intact, no ulcerations or rash noted.  Psych: Good eye contact, normal affect. Memory intact not anxious or depressed appearing.  CNS: CN 2-12 intact, power,  normal throughout.no focal deficits noted.   Assessment & Plan  LUQ abdominal pain 3 week h/o LUQ pain, needs DG abd  Chronic left flank pain [place] left renal  Acute cystitis without hematuria 5 day keflex course and follow c/s  Uncontrolled type 2 diabetes mellitus with nephropathy ([place]) Upcoming appt with Endo  Essential hypertension Controlled, no change  in medication DASH diet and commitment to daily physical activity for a minimum of 30 minutes discussed and encouraged, as a part of hypertension management. The importance of attaining a healthy weight is also discussed.  BP/Weight 07/20/2020 07/06/2020 10/21/2019 06/25/2019 05/06/2018 03/25/2017 3/55/9741  Systolic BP 638 453 646 803 212 248 250  Diastolic BP 72 71 74 82 78 70 77  Wt. (Lbs) 158.12 156 161 161 162 164 170  BMI 27.14 26.78 27.64 27.64 27.81 28.15 29.18

## 2020-07-21 ENCOUNTER — Other Ambulatory Visit (HOSPITAL_COMMUNITY)
Admission: RE | Admit: 2020-07-21 | Discharge: 2020-07-21 | Disposition: A | Discharge status: Home / Self Care | Payer: No Typology Code available for payment source | Source: Other Acute Inpatient Hospital | Attending: Family Medicine | Admitting: Family Medicine

## 2020-07-21 ENCOUNTER — Ambulatory Visit (HOSPITAL_COMMUNITY)
Admission: RE | Admit: 2020-07-21 | Discharge: 2020-07-21 | Disposition: A | Discharge status: Home / Self Care | Payer: 59 | Source: Ambulatory Visit | Attending: Family Medicine | Admitting: Family Medicine

## 2020-07-21 DIAGNOSIS — N3000 Acute cystitis without hematuria: Secondary | ICD-10-CM | POA: Diagnosis present

## 2020-07-21 DIAGNOSIS — R1012 Left upper quadrant pain: Secondary | ICD-10-CM

## 2020-07-22 LAB — URINE CULTURE: Culture: <10000 — AB

## 2020-07-24 ENCOUNTER — Encounter: Payer: Self-pay | Admitting: Family Medicine

## 2020-07-24 DIAGNOSIS — N3000 Acute cystitis without hematuria: Secondary | ICD-10-CM | POA: Insufficient documentation

## 2020-07-24 NOTE — Assessment & Plan Note (Signed)
Controlled, no change in medication DASH diet and commitment to daily physical activity for a minimum of 30 minutes discussed and encouraged, as a part of hypertension management. The importance of attaining a healthy weight is also discussed.  BP/Weight 07/20/2020 07/06/2020 10/21/2019 06/25/2019 05/06/2018 03/25/2017 0/81/4481  Systolic BP 856 314 970 263 785 885 027  Diastolic BP 72 71 74 82 78 70 77  Wt. (Lbs) 158.12 156 161 161 162 164 170  BMI 27.14 26.78 27.64 27.64 27.81 28.15 29.18

## 2020-07-24 NOTE — Assessment & Plan Note (Signed)
Upcoming appt with Endo

## 2020-07-24 NOTE — Assessment & Plan Note (Signed)
[place] left renal

## 2020-07-24 NOTE — Assessment & Plan Note (Signed)
5 day keflex course and follow c/s

## 2020-07-27 ENCOUNTER — Ambulatory Visit (HOSPITAL_COMMUNITY): Payer: 59

## 2020-08-04 ENCOUNTER — Other Ambulatory Visit: Payer: Self-pay

## 2020-08-04 ENCOUNTER — Ambulatory Visit (HOSPITAL_COMMUNITY)
Admission: RE | Admit: 2020-08-04 | Discharge: 2020-08-04 | Disposition: A | Discharge status: Home / Self Care | Payer: No Typology Code available for payment source | Source: Ambulatory Visit | Attending: Family Medicine | Admitting: Family Medicine

## 2020-08-04 DIAGNOSIS — G8929 Other chronic pain: Secondary | ICD-10-CM

## 2020-08-04 DIAGNOSIS — R1012 Left upper quadrant pain: Secondary | ICD-10-CM | POA: Diagnosis present

## 2020-08-04 DIAGNOSIS — R109 Unspecified abdominal pain: Secondary | ICD-10-CM | POA: Insufficient documentation

## 2020-08-06 ENCOUNTER — Other Ambulatory Visit: Payer: Self-pay | Admitting: Family Medicine

## 2020-08-06 DIAGNOSIS — I517 Cardiomegaly: Secondary | ICD-10-CM

## 2020-08-06 DIAGNOSIS — Z794 Long term (current) use of insulin: Secondary | ICD-10-CM

## 2020-08-06 DIAGNOSIS — E1159 Type 2 diabetes mellitus with other circulatory complications: Secondary | ICD-10-CM

## 2020-08-06 DIAGNOSIS — R0989 Other specified symptoms and signs involving the circulatory and respiratory systems: Secondary | ICD-10-CM

## 2020-08-06 DIAGNOSIS — I152 Hypertension secondary to endocrine disorders: Secondary | ICD-10-CM

## 2020-08-11 ENCOUNTER — Ambulatory Visit: Payer: Self-pay | Admitting: Podiatry

## 2020-08-11 ENCOUNTER — Telehealth: Payer: Self-pay | Admitting: Family Medicine

## 2020-08-11 NOTE — Telephone Encounter (Signed)
Patient returned a phone call to you in regards to labs [phone]

## 2020-08-16 ENCOUNTER — Ambulatory Visit: Payer: No Typology Code available for payment source | Admitting: "Endocrinology

## 2020-08-19 NOTE — Telephone Encounter (Signed)
Patient aware of results. Spoke to patient last week.

## 2020-08-24 ENCOUNTER — Ambulatory Visit: Payer: 59 | Admitting: Family Medicine

## 2020-08-29 ENCOUNTER — Telehealth: Payer: Self-pay | Admitting: Family Medicine

## 2020-08-29 ENCOUNTER — Other Ambulatory Visit: Payer: Self-pay

## 2020-08-29 ENCOUNTER — Ambulatory Visit (INDEPENDENT_AMBULATORY_CARE_PROVIDER_SITE_OTHER): Payer: No Typology Code available for payment source | Admitting: Podiatry

## 2020-08-29 DIAGNOSIS — M79674 Pain in right toe(s): Secondary | ICD-10-CM

## 2020-08-29 DIAGNOSIS — B351 Tinea unguium: Secondary | ICD-10-CM

## 2020-08-29 DIAGNOSIS — E1121 Type 2 diabetes mellitus with diabetic nephropathy: Secondary | ICD-10-CM

## 2020-08-29 DIAGNOSIS — Q828 Other specified congenital malformations of skin: Secondary | ICD-10-CM

## 2020-08-29 DIAGNOSIS — M79675 Pain in left toe(s): Secondary | ICD-10-CM | POA: Diagnosis not present

## 2020-08-29 DIAGNOSIS — E1165 Type 2 diabetes mellitus with hyperglycemia: Secondary | ICD-10-CM

## 2020-08-29 DIAGNOSIS — IMO0002 Reserved for concepts with insufficient information to code with codable children: Secondary | ICD-10-CM

## 2020-08-29 NOTE — Telephone Encounter (Signed)
Patient calling she states she recently got test results back and is currently waiting on her appointment with Dr Harl Bowie but has concerns and asked to speak with Dr Moshe Cipro I advised that I could take a message but that she was in with patients P# [phone]

## 2020-08-30 NOTE — Telephone Encounter (Signed)
Appointment cancelled with Dr.Branch

## 2020-08-30 NOTE — Telephone Encounter (Signed)
Has cataract surgery scheduled tomorrow

## 2020-08-30 NOTE — Telephone Encounter (Signed)
Please  CALL AND cancel the referral/ appointment with Dr Harl Bowie , as pt states she had cardiology evaluation at the [facility] in 05/2020, thankl you. ?? Please ask!

## 2020-08-30 NOTE — Telephone Encounter (Signed)
All reports are reviewed with patient to her understanding She states that in July 2021 she had Cardiology in [place] thru the [place] evaluate her with echo and stress test, and this was all normal . I have asked that she send for a copy of those records , and I am cancelling her Cardiology referral based on her reporting.  She understands and agrees

## 2020-08-31 ENCOUNTER — Telehealth: Payer: No Typology Code available for payment source | Admitting: Family Medicine

## 2020-09-01 ENCOUNTER — Telehealth: Payer: No Typology Code available for payment source | Admitting: Family Medicine

## 2020-09-05 ENCOUNTER — Ambulatory Visit: Payer: 59 | Admitting: "Endocrinology

## 2020-09-05 NOTE — Progress Notes (Signed)
Subjective: 64 y.o. returns the office today for painful, elongated, thickened toenails which she cannot trim herself and for calluses which are causing pain. Denies any redness or drainage around the nails. Denies any acute changes since last appointment and no new complaints today. Denies any systemic complaints such as fevers, chills, nausea, vomiting.   PCP: Fayrene Helper, MD   Objective: AAO 3, NAD DP/PT pulses palpable, CRT less than 3 seconds Nails hypertrophic, dystrophic, elongated, brittle, discolored 10. There is tenderness overlying the nails 1-5 bilaterally. There is no surrounding erythema or drainage along the nail sites. Hyperkeratotic lesions present bilateral without any underlying ulceration/drainage or signs of infection.  No open lesions.  No other areas of tenderness bilateral lower extremities. No overlying edema, erythema, increased warmth. No pain with calf compression, swelling, warmth, erythema.  Assessment: Patient presents with symptomatic onychomycosis. Hyperkeratotic lesions  Plan: -Treatment options including alternatives, risks, complications were discussed -Nails sharply debrided 10 without complication/bleeding. -Hyperkeratotic lesions sharply debrided x 2 without any complications or bleeding. -Discussed daily foot inspection. If there are any changes, to call the office immediately.  -Follow-up in 3 months or sooner if any problems are to arise. In the meantime, encouraged to call the office with any questions, concerns, changes symptoms.  Celesta Gentile, DPM

## 2020-09-08 ENCOUNTER — Ambulatory Visit: Payer: 59 | Admitting: "Endocrinology

## 2020-09-08 ENCOUNTER — Ambulatory Visit: Payer: Non-veteran care | Admitting: Nutrition

## 2020-10-06 ENCOUNTER — Ambulatory Visit: Payer: Managed Care, Other (non HMO) | Admitting: Family Medicine

## 2020-10-10 ENCOUNTER — Ambulatory Visit: Payer: Non-veteran care | Admitting: Cardiology

## 2020-10-24 ENCOUNTER — Ambulatory Visit: Payer: No Typology Code available for payment source | Admitting: Family Medicine

## 2020-11-22 ENCOUNTER — Ambulatory Visit: Payer: No Typology Code available for payment source | Admitting: Family Medicine

## 2020-11-28 ENCOUNTER — Other Ambulatory Visit: Payer: Self-pay

## 2020-11-28 ENCOUNTER — Telehealth (INDEPENDENT_AMBULATORY_CARE_PROVIDER_SITE_OTHER): Payer: No Typology Code available for payment source | Admitting: Family Medicine

## 2020-11-28 ENCOUNTER — Encounter: Payer: Self-pay | Admitting: Family Medicine

## 2020-11-28 VITALS — BP 123/70 | Ht 64.0 in | Wt 158.0 lb

## 2020-11-28 DIAGNOSIS — Z780 Asymptomatic menopausal state: Secondary | ICD-10-CM | POA: Diagnosis not present

## 2020-11-28 DIAGNOSIS — J0140 Acute pansinusitis, unspecified: Secondary | ICD-10-CM

## 2020-11-28 DIAGNOSIS — E1121 Type 2 diabetes mellitus with diabetic nephropathy: Secondary | ICD-10-CM

## 2020-11-28 DIAGNOSIS — I10 Essential (primary) hypertension: Secondary | ICD-10-CM

## 2020-11-28 DIAGNOSIS — F322 Major depressive disorder, single episode, severe without psychotic features: Secondary | ICD-10-CM

## 2020-11-28 DIAGNOSIS — IMO0002 Reserved for concepts with insufficient information to code with codable children: Secondary | ICD-10-CM

## 2020-11-28 DIAGNOSIS — E663 Overweight: Secondary | ICD-10-CM

## 2020-11-28 DIAGNOSIS — E1165 Type 2 diabetes mellitus with hyperglycemia: Secondary | ICD-10-CM

## 2020-11-28 MED ORDER — AZITHROMYCIN 250 MG PO TABS: ORAL_TABLET | ORAL | 0 refills | Status: DC

## 2020-11-28 MED ORDER — LOSARTAN POTASSIUM 50 MG PO TABS: 50.0000 mg | ORAL_TABLET | Freq: Every day | ORAL | 3 refills | Status: DC

## 2020-11-28 NOTE — Patient Instructions (Addendum)
F/U in office with MD re evaluate blood pressure in 6 weeks, call if you need me sooner  Nurse visit next  week for pneumonia 23 vaccine  Please schedule bone density test and provide appt info  NOn Fasting , cmp and eGFr, hBa1C next week, same day as pneumonia 23 vaccine  Z pack prescribed for sinus infection, complete this before pneumonia vaccine   Stop lisinopril due to cough  New is losartan 50 mg one daily for your blood pressure and kidney protection  Think about what you will eat, plan ahead. Choose " clean, green, fresh or frozen" over canned, processed or packaged foods which are more sugary, salty and fatty. 70 to 75% of food eaten should be vegetables and fruit. Three meals at set times with snacks allowed between meals, but they must be fruit or vegetables. Aim to eat over a 12 hour period , example 7 am to 7 pm, and STOP after  your last meal of the day. Drink water,generally about 64 ounces per day, no other drink is as healthy. Fruit juice is best enjoyed in a healthy way, by EATING the fruit.  Thanks for choosing [facility], we consider it a privelige to serve you.  Best for 2022!

## 2020-11-28 NOTE — Assessment & Plan Note (Signed)
Updated lab needed at/ before next visit. Michaela [name] is reminded of the importance of commitment to daily physical activity for 30 minutes or more, as able and the need to limit carbohydrate intake to 30 to 60 grams per meal to help with blood sugar control.   The need to take medication as prescribed, test blood sugar as directed, and to call between visits if there is a concern that blood sugar is uncontrolled is also discussed.   Michaela [name] is reminded of the importance of daily foot exam, annual eye examination, and good blood sugar, blood pressure and cholesterol control.  Diabetic Labs Latest Ref Rng & Units 07/06/2020 07/06/2020 07/06/2020 07/06/2020 07/15/2019  HbA1c 4.0 - 5.6 % 8.4(A) 8.4(A) 8.4 8.4(A) 8.6  Microalbumin 0.00 - 1.89 mg/dL - - - - -  Micro/Creat Ratio 0.0 - 30.0 mg/g - - - - -  Chol 0 - 200 mg/dL - - - - -  HDL >38 mg/dL - - - - -  Calc LDL - - - - - 64  Triglycerides <150 mg/dL - - - - -  Creatinine 2.50 - 1.10 mg/dL - - - - -   BP/Weight 03/28/9766 07/20/2020 07/06/2020 10/21/2019 06/25/2019 05/06/2018 03/25/2017  Systolic BP 123 131 148 122 128 130 120  Diastolic BP 70 72 71 74 82 78 70  Wt. (Lbs) 158 158.12 156 161 161 162 164  BMI 27.12 27.14 26.78 27.64 27.64 27.81 28.15   Foot/eye exam completion dates 07/06/2020 06/25/2019  Foot Form Completion Done Done

## 2020-11-28 NOTE — Assessment & Plan Note (Signed)
Change ACE to ARB , possibly allergic to ACE DASH diet and commitment to daily physical activity for a minimum of 30 minutes discussed and encouraged, as a part of hypertension management. The importance of attaining a healthy weight is also discussed.  BP/Weight 11/28/2020 07/20/2020 07/06/2020 10/21/2019 06/25/2019 05/06/2018 03/25/2017  Systolic BP 123 131 148 122 128 130 120  Diastolic BP 70 72 71 74 82 78 70  Wt. (Lbs) 158 158.12 156 161 161 162 164  BMI 27.12 27.14 26.78 27.64 27.64 27.81 28.15

## 2020-11-28 NOTE — Assessment & Plan Note (Signed)
Z pack prescribed 

## 2020-11-28 NOTE — Progress Notes (Signed)
Virtual Visit via Telephone Note  I connected with Michaela Michaela [name] on 11/28/20 at  3:20 PM EST by telephone and verified that I am speaking with the correct person using two identifiers.  Location: Patient: home Provider: office   I discussed the limitations, risks, security and privacy concerns of performing an evaluation and management service by telephone and the availability of in person appointments. I also discussed with the patient that there may be a patient responsible charge related to this service. The patient expressed understanding and agreed to proceed.   History of Present Illness: 2 week h/o post nasal yellow, non productive brown drainage, no fever , chills, sore throat. Intermittent cough Denies recent fever or chills. Denies chest congestion, productive cough or wheezing.c/o dry tickle cough Denies chest pains, palpitations and leg swelling Denies abdominal pain, nausea, vomiting,diarrhea or constipation.   Denies dysuria, frequency, hesitancy or incontinence. Denies joint pain, swelling and limitation in mobility. Denies headaches, seizures, numbness, or tingling. Denies uncontrolled depression, anxiety or insomnia. Denies skin break down or rash. Denies polyuria, polydipsia, blurred vision , or hypoglycemic episodes.        Observations/Objective: BP 123/70   Ht 5\' 4"  (1.626 m)   Wt 158 lb (71.7 kg)   BMI 27.12 kg/m  Good communication with no confusion and intact memory. Alert and oriented x 3 No signs of respiratory distress during speech    Assessment and Plan: Sinusitis Z pack prescribed  Uncontrolled type 2 diabetes mellitus with nephropathy (HCC) Updated lab needed at/ before next visit. Michaela Michaela [name] is reminded of the importance of commitment to daily physical activity for 30 minutes or more, as able and the need to limit carbohydrate intake to 30 to 60 grams per meal to help with blood sugar control.   The need to take medication as prescribed,  test blood sugar as directed, and to call between visits if there is a concern that blood sugar is uncontrolled is also discussed.   Michaela Michaela [name] is reminded of the importance of daily foot exam, annual eye examination, and good blood sugar, blood pressure and cholesterol control.  Diabetic Labs Latest Ref Rng & Units 07/06/2020 07/06/2020 07/06/2020 07/06/2020 07/15/2019  HbA1c 4.0 - 5.6 % 8.4(A) 8.4(A) 8.4 8.4(A) 8.6  Microalbumin 0.00 - 1.89 mg/dL - - - - -  Micro/Creat Ratio 0.0 - 30.0 mg/g - - - - -  Chol 0 - 200 mg/dL - - - - -  HDL 07/17/2019 mg/dL - - - - -  Calc LDL - - - - - 64  Triglycerides <150 mg/dL - - - - -  Creatinine 02-02-2006 - 1.10 mg/dL - - - - -   BP/Weight 6.27 07/20/2020 07/06/2020 10/21/2019 06/25/2019 05/06/2018 03/25/2017  Systolic BP 123 131 148 122 128 130 120  Diastolic BP 70 72 71 74 82 78 70  Wt. (Lbs) 158 158.12 156 161 161 162 164  BMI 27.12 27.14 26.78 27.64 27.64 27.81 28.15   Foot/eye exam completion dates 07/06/2020 06/25/2019  Foot Form Completion Done Done        Depression, major, single episode, severe (HCC) Controlled, no change in medication   Essential hypertension Change ACE to ARB , possibly allergic to ACE DASH diet and commitment to daily physical activity for a minimum of 30 minutes discussed and encouraged, as a part of hypertension management. The importance of attaining a healthy weight is also discussed.  BP/Weight 11/28/2020 07/20/2020 07/06/2020 10/21/2019 06/25/2019 05/06/2018 03/25/2017  Systolic BP 123 131  148 122 [id number]  Diastolic BP 70 72 71 74 82 78 70  Wt. (Lbs) 158 158.12 156 161 161 162 164  BMI 27.12 27.14 26.78 27.64 27.64 27.81 28.15       Overweight (BMI 25.0-29.9)  Patient re-educated about  the importance of commitment to a  minimum of 150 minutes of exercise per week as able.  The importance of healthy food choices with portion control discussed, as Michaela [name] as eating regularly and within a 12 hour window most days. The  need to choose "clean , green" food 50 to 75% of the time is discussed, as Michaela [name] as to make water the primary drink and set a goal of 64 ounces water daily.    Weight /BMI 11/28/2020 07/20/2020 07/06/2020  WEIGHT 158 lb 158 lb 1.9 oz 156 lb  HEIGHT 5\' 4"  5\' 4"  5\' 4"   BMI 27.12 kg/m2 27.14 kg/m2 26.78 kg/m2        Follow Up Instructions:    I discussed the assessment and treatment plan with the patient. The patient was provided an opportunity to ask questions and all were answered. The patient agreed with the plan and demonstrated an understanding of the instructions.   The patient was advised to call back or seek an in-person evaluation if the symptoms worsen or if the condition fails to improve as anticipated.  I provided 21 minutes of non-face-to-face time during this encounter.   Tula Nakayama, MD

## 2020-11-28 NOTE — Assessment & Plan Note (Signed)
Controlled, no change in medication  

## 2020-11-28 NOTE — Assessment & Plan Note (Signed)
  Patient re-educated about  the importance of commitment to a  minimum of 150 minutes of exercise per week as able.  The importance of healthy food choices with portion control discussed, as well as eating regularly and within a 12 hour window most days. The need to choose "clean , green" food 50 to 75% of the time is discussed, as well as to make water the primary drink and set a goal of 64 ounces water daily.    Weight /BMI 11/28/2020 07/20/2020 07/06/2020  WEIGHT 158 lb 158 lb 1.9 oz 156 lb  HEIGHT 5\' 4"  5\' 4"  5\' 4"   BMI 27.12 kg/m2 27.14 kg/m2 26.78 kg/m2

## 2020-11-29 ENCOUNTER — Ambulatory Visit (INDEPENDENT_AMBULATORY_CARE_PROVIDER_SITE_OTHER): Payer: No Typology Code available for payment source | Admitting: Podiatry

## 2020-11-29 ENCOUNTER — Other Ambulatory Visit: Payer: Self-pay

## 2020-11-29 DIAGNOSIS — M79674 Pain in right toe(s): Secondary | ICD-10-CM

## 2020-11-29 DIAGNOSIS — E1121 Type 2 diabetes mellitus with diabetic nephropathy: Secondary | ICD-10-CM

## 2020-11-29 DIAGNOSIS — E1165 Type 2 diabetes mellitus with hyperglycemia: Secondary | ICD-10-CM

## 2020-11-29 DIAGNOSIS — M79675 Pain in left toe(s): Secondary | ICD-10-CM

## 2020-11-29 DIAGNOSIS — Q828 Other specified congenital malformations of skin: Secondary | ICD-10-CM

## 2020-11-29 DIAGNOSIS — B351 Tinea unguium: Secondary | ICD-10-CM | POA: Diagnosis not present

## 2020-11-29 DIAGNOSIS — IMO0002 Reserved for concepts with insufficient information to code with codable children: Secondary | ICD-10-CM

## 2020-11-30 NOTE — Progress Notes (Signed)
Subjective: 65 y.o. returns the office today for painful, elongated, thickened toenails which she cannot trim herself and for calluses which are causing discomfort. Denies any redness or drainage around the nails. Denies any acute changes since last appointment and no new complaints today. Denies any systemic complaints such as fevers, chills, nausea, vomiting.   PCP: Kerri Perches, MD Last seen: 11/28/2020  Last A1c 07/06/2020 at 8.4   Objective: AAO 3, NAD DP/PT pulses palpable, CRT less than 3 seconds Nails hypertrophic, dystrophic, elongated, brittle, discolored 10. There is tenderness overlying the nails 1-5 bilaterally. There is no surrounding erythema or drainage along the nail sites. Hyperkeratotic lesions present bilateral without any underlying ulceration/drainage or signs of infection.  No open lesions.  No other areas of tenderness bilateral lower extremities. No overlying edema, erythema, increased warmth. No pain with calf compression, swelling, warmth, erythema.  Assessment: Patient presents with symptomatic onychomycosis. Hyperkeratotic lesions  Plan: -Treatment options including alternatives, risks, complications were discussed -Nails sharply debrided 10 without complication/bleeding. -Hyperkeratotic lesions sharply debrided x 2 without any complications or bleeding. -Discussed daily foot inspection. If there are any changes, to call the office immediately.  -Follow-up in 3 months or sooner if any problems are to arise. In the meantime, encouraged to call the office with any questions, concerns, changes symptoms.  Ovid Curd, DPM

## 2020-12-05 ENCOUNTER — Ambulatory Visit: Payer: No Typology Code available for payment source

## 2020-12-09 ENCOUNTER — Other Ambulatory Visit (HOSPITAL_COMMUNITY): Payer: No Typology Code available for payment source

## 2020-12-09 ENCOUNTER — Ambulatory Visit: Payer: No Typology Code available for payment source

## 2020-12-14 ENCOUNTER — Ambulatory Visit (INDEPENDENT_AMBULATORY_CARE_PROVIDER_SITE_OTHER): Payer: No Typology Code available for payment source

## 2020-12-14 ENCOUNTER — Other Ambulatory Visit: Payer: Self-pay

## 2020-12-14 DIAGNOSIS — Z23 Encounter for immunization: Secondary | ICD-10-CM

## 2020-12-15 ENCOUNTER — Ambulatory Visit (HOSPITAL_COMMUNITY)
Admission: RE | Admit: 2020-12-15 | Discharge: 2020-12-15 | Disposition: A | Discharge status: Home / Self Care | Payer: No Typology Code available for payment source | Source: Ambulatory Visit | Attending: Family Medicine | Admitting: Family Medicine

## 2020-12-15 DIAGNOSIS — Z780 Asymptomatic menopausal state: Secondary | ICD-10-CM | POA: Diagnosis not present

## 2020-12-15 LAB — CMP14+EGFR
ALT: 24 IU/L (ref 0–32)
AST: 24 IU/L (ref 0–40)
Albumin/Globulin Ratio: 1.7 (ref 1.2–2.2)
Albumin: 4.3 g/dL (ref 3.8–4.8)
Alkaline Phosphatase: 44 IU/L (ref 44–121)
BUN/Creatinine Ratio: 15 (ref 12–28)
BUN: 14 mg/dL (ref 8–27)
Bilirubin Total: 0.5 mg/dL (ref 0.0–1.2)
CO2: 21 mmol/L (ref 20–29)
Calcium: 9.9 mg/dL (ref 8.7–10.3)
Chloride: 104 mmol/L (ref 96–106)
Creatinine, Ser: 0.91 mg/dL (ref 0.57–1.00)
GFR calc Af Amer: 77 mL/min/{1.73_m2} (ref >59)
GFR calc non Af Amer: 66 mL/min/{1.73_m2} (ref >59)
Globulin, Total: 2.5 g/dL (ref 1.5–4.5)
Glucose: 141 mg/dL — ABNORMAL HIGH (ref 65–99)
Potassium: 3.9 mmol/L (ref 3.5–5.2)
Sodium: 140 mmol/L (ref 134–144)
Total Protein: 6.8 g/dL (ref 6.0–8.5)

## 2020-12-15 LAB — HEMOGLOBIN A1C
Est. average glucose Bld gHb Est-mCnc: 200 mg/dL
Hgb A1c MFr Bld: 8.6 % — ABNORMAL HIGH (ref 4.8–5.6)

## 2020-12-22 ENCOUNTER — Encounter: Payer: Self-pay | Admitting: Family Medicine

## 2020-12-22 ENCOUNTER — Telehealth (INDEPENDENT_AMBULATORY_CARE_PROVIDER_SITE_OTHER): Payer: No Typology Code available for payment source | Admitting: Family Medicine

## 2020-12-22 ENCOUNTER — Other Ambulatory Visit: Payer: Self-pay

## 2020-12-22 VITALS — Ht 64.0 in | Wt 158.0 lb

## 2020-12-22 DIAGNOSIS — IMO0002 Reserved for concepts with insufficient information to code with codable children: Secondary | ICD-10-CM

## 2020-12-22 DIAGNOSIS — E1165 Type 2 diabetes mellitus with hyperglycemia: Secondary | ICD-10-CM

## 2020-12-22 DIAGNOSIS — E1121 Type 2 diabetes mellitus with diabetic nephropathy: Secondary | ICD-10-CM | POA: Diagnosis not present

## 2020-12-22 DIAGNOSIS — E785 Hyperlipidemia, unspecified: Secondary | ICD-10-CM

## 2020-12-22 DIAGNOSIS — M810 Age-related osteoporosis without current pathological fracture: Secondary | ICD-10-CM

## 2020-12-22 DIAGNOSIS — I10 Essential (primary) hypertension: Secondary | ICD-10-CM

## 2020-12-22 DIAGNOSIS — F322 Major depressive disorder, single episode, severe without psychotic features: Secondary | ICD-10-CM

## 2020-12-22 DIAGNOSIS — F418 Other specified anxiety disorders: Secondary | ICD-10-CM | POA: Diagnosis not present

## 2020-12-22 MED ORDER — ALENDRONATE SODIUM 70 MG PO TABS
70.0000 mg | ORAL_TABLET | ORAL | 3 refills | Status: DC
Start: 2020-12-22 — End: 2022-01-11

## 2020-12-22 MED ORDER — INSULIN GLARGINE 100 UNITS/ML SOLOSTAR PEN
30.0000 [IU] | PEN_INJECTOR | Freq: Every day | SUBCUTANEOUS | 3 refills | Status: DC
Start: 2020-12-22 — End: 2022-01-11

## 2020-12-22 MED ORDER — GLIPIZIDE ER 10 MG PO TB24
10.0000 mg | ORAL_TABLET | Freq: Every day | ORAL | 3 refills | Status: DC
Start: 2020-12-22 — End: 2021-01-26

## 2020-12-22 MED ORDER — GLIPIZIDE ER 5 MG PO TB24
5.0000 mg | ORAL_TABLET | Freq: Every day | ORAL | 3 refills | Status: DC
Start: 2020-12-22 — End: 2021-01-26

## 2020-12-22 MED ORDER — OSCAL 500/200 D-3 500-200 MG-UNIT PO TABS: 1.0000 | ORAL_TABLET | Freq: Two times a day (BID) | ORAL | 3 refills | Status: DC

## 2020-12-22 NOTE — Patient Instructions (Signed)
F/U in 5 weeks, with blood sugar log, call if you need me sooner  Test and record blood sugar 3 times daily, fasting, 2 hours after lunch and at bedtime  Increase lantus to 30 units daily  New is long acting glipizide 10 mg and long acting glipizide 5 mg , total is glipizide 15 mg long acting in the morning Continue taking your glipizide that you have as you are doing . STOP as soon as you get th new long acting lipizide  Goal for fasting blood sugar ranges from 90 to 130 and 2 hours after any meal or at bedtime should be between 140 to 180.  It is important that you exercise regularly at least 30 minutes 5 times a week. If you develop chest pain, have severe difficulty breathing, or feel very tired, stop exercising immediately and seek medical attention    You are referred again to diabetic educator, vital that you go  New for osteoporosis is once weekly fosamax and twice daily calcium with D  PLease contact nurse today so she can fax in you rscripts to the [place] once you are sure they will fill them  Thanks for choosing [facility], we consider it a privelige to serve you.

## 2020-12-22 NOTE — Progress Notes (Signed)
Virtual Visit via Telephone Note  I connected with DALISSA LOVIN on 12/22/20 at  1:00 PM EST by telephone and verified that I am speaking with the correct person using two identifiers.  Location: Patient:home Provider: office   I discussed the limitations, risks, security and privacy concerns of performing an evaluation and management service by telephone and the availability of in person appointments. I also discussed with the patient that there may be a patient responsible charge related to this service. The patient expressed understanding and agreed to proceed.   History of Present Illness: F/U chronic problems and address any new or current concerns. Review and update medications and allergies. Review recent lab and radiologic data . Update routine health maintainace. Review an encourage improved health habits to include nutrition, exercise and  sleep .  Denies recent fever or chills. Denies sinus pressure, nasal congestion, ear pain or sore throat. Denies chest congestion, productive cough or wheezing. Denies chest pains, palpitations and leg swelling Denies abdominal pain, nausea, vomiting,diarrhea or constipation.   Denies dysuria, frequency, hesitancy or incontinence. Denies joint pain, swelling and limitation in mobility. Denies headaches, seizures, numbness, or tingling. Denies uncontrolled depression, anxiety or insomnia. Denies skin break down or rash.       Observations/Objective: Ht 5\' 4"  (1.626 m)   Wt 158 lb (71.7 kg)   BMI 27.12 kg/m  Good communication with no confusion and intact memory. Alert and oriented x 3 No signs of respiratory distress during speech    Assessment and Plan:  Essential hypertension DASH diet and commitment to daily physical activity for a minimum of 30 minutes discussed and encouraged, as a part of hypertension management. The importance of attaining a healthy weight is also discussed.  BP/Weight 12/22/2020 11/28/2020 07/20/2020  07/06/2020 10/21/2019 06/25/2019 06/04/6268  Systolic BP - 485 462 703 500 938 182  Diastolic BP - 70 72 71 74 82 78  Wt. (Lbs) 158 158 158.12 156 161 161 162  BMI 27.12 27.12 27.14 26.78 27.64 27.64 27.81   Controlled, no change in medication     Depression with anxiety Controlled, no change in medication   Uncontrolled type 2 diabetes mellitus with nephropathy ([place]) Ms. Bonser is reminded of the importance of commitment to daily physical activity for 30 minutes or more, as able and the need to limit carbohydrate intake to 30 to 60 grams per meal to help with blood sugar control.   The need to take medication as prescribed, test blood sugar as directed, and to call between visits if there is a concern that blood sugar is uncontrolled is also discussed.   Ms. Haggart is reminded of the importance of daily foot exam, annual eye examination, and good blood sugar, blood pressure and cholesterol control. Uncontrolled, increase glipizide to 15 mg daily and lantus to 30 units . F/u with log in 6 weeks  Diabetic Labs Latest Ref Rng & Units 12/14/2020 07/06/2020 07/06/2020 07/06/2020 07/06/2020  HbA1c 4.8 - 5.6 % 8.6(H) 8.4(A) 8.4(A) 8.4 8.4(A)  Microalbumin 0.00 - 1.89 mg/dL - - - - -  Micro/Creat Ratio 0.0 - 30.0 mg/g - - - - -  Chol 0 - 200 mg/dL - - - - -  HDL >39 mg/dL - - - - -  Calc LDL - - - - - -  Triglycerides <150 mg/dL - - - - -  Creatinine 0.57 - 1.00 mg/dL 0.91 - - - -   BP/Weight 12/22/2020 11/28/2020 07/20/2020 07/06/2020 10/21/2019 06/25/2019 9/93/7169  Systolic BP -  123 131 148 384 536 468  Diastolic BP - 70 72 71 74 82 78  Wt. (Lbs) 158 158 158.12 156 161 161 162  BMI 27.12 27.12 27.14 26.78 27.64 27.64 27.81   Foot/eye exam completion dates 07/06/2020 06/25/2019  Foot Form Completion Done Done        Hyperlipidemia LDL goal <100 Hyperlipidemia:Low fat diet discussed and encouraged.   Lipid Panel  Lab Results  Component Value Date   CHOL 265 (H) 02/03/2014   HDL 44  02/03/2014   LDLCALC 64 07/15/2019   TRIG 204 (H) 02/03/2014   CHOLHDL 6.0 02/03/2014   Updated lab needed      Depression, major, single episode, severe (HCC) Controlled, no change in management   Age-related osteoporosis without current pathological fracture Start weekly fosamax, calcium with D daily and daily weight bearing activity   Follow Up Instructions:    I discussed the assessment and treatment plan with the patient. The patient was provided an opportunity to ask questions and all were answered. The patient agreed with the plan and demonstrated an understanding of the instructions.   The patient was advised to call back or seek an in-person evaluation if the symptoms worsen or if the condition fails to improve as anticipated.  I provided 22 minutes of non-face-to-face time during this encounter.   Tula Nakayama, MD

## 2020-12-26 ENCOUNTER — Other Ambulatory Visit: Payer: Self-pay

## 2020-12-26 ENCOUNTER — Telehealth: Payer: Self-pay

## 2020-12-26 NOTE — Telephone Encounter (Signed)
Would prefer to keep the glipizid as ER and have her fill locally, may not be too expensive, yes the calcium with D change is fine

## 2020-12-26 NOTE — Telephone Encounter (Signed)
[facility] called and said that the glipizide xl is ot on their formulary and wants to know if her glipizide can be changed to the regular release of the 5mg  and 10mg .  Also if the calcium can be changed to the calcium D 500/400 twice daily? If you agree, I will fax the rx's to them.

## 2020-12-27 MED ORDER — CALCIUM CARB-CHOLECALCIFEROL 500-400 MG-UNIT PO TABS: ORAL_TABLET | ORAL | 1 refills | Status: AC

## 2020-12-27 NOTE — Telephone Encounter (Signed)
Called patient and left message for them to return call at the office   

## 2020-12-27 NOTE — Telephone Encounter (Signed)
Wants to call back to the [place] to see why she can't get them there. Said whatever the dr writes they are supposed to provide. WIll call back

## 2020-12-29 ENCOUNTER — Telehealth: Payer: Self-pay

## 2020-12-29 NOTE — Telephone Encounter (Signed)
[facility]ist said that they have it documented that pt is allergic to calcium acetate and wanted to make sure it was ok to dispense the calcium 500/400 that was sent to them. Called pt to verify allergy status since she has been taking calcium

## 2021-01-01 DIAGNOSIS — M810 Age-related osteoporosis without current pathological fracture: Secondary | ICD-10-CM | POA: Insufficient documentation

## 2021-01-01 NOTE — Assessment & Plan Note (Signed)
DASH diet and commitment to daily physical activity for a minimum of 30 minutes discussed and encouraged, as a part of hypertension management. The importance of attaining a healthy weight is also discussed.  BP/Weight 12/22/2020 11/28/2020 07/20/2020 07/06/2020 10/21/2019 06/25/2019 5/46/5035  Systolic BP - 465 681 275 170 017 494  Diastolic BP - 70 72 71 74 82 78  Wt. (Lbs) 158 158 158.12 156 161 161 162  BMI 27.12 27.12 27.14 26.78 27.64 27.64 27.81   Controlled, no change in medication

## 2021-01-01 NOTE — Assessment & Plan Note (Signed)
Controlled , no change in management 

## 2021-01-01 NOTE — Assessment & Plan Note (Signed)
Start weekly fosamax, calcium with D daily and daily weight bearing activity

## 2021-01-01 NOTE — Assessment & Plan Note (Signed)
Michaela [name] is reminded of the importance of commitment to daily physical activity for 30 minutes or more, as able and the need to limit carbohydrate intake to 30 to 60 grams per meal to help with blood sugar control.   The need to take medication as prescribed, test blood sugar as directed, and to call between visits if there is a concern that blood sugar is uncontrolled is also discussed.   Michaela [name] is reminded of the importance of daily foot exam, annual eye examination, and good blood sugar, blood pressure and cholesterol control. Uncontrolled, increase glipizide to 15 mg daily and lantus to 30 units . F/u with log in 6 weeks  Diabetic Labs Latest Ref Rng & Units 12/14/2020 07/06/2020 07/06/2020 07/06/2020 07/06/2020  HbA1c 4.8 - 5.6 % 8.6(H) 8.4(A) 8.4(A) 8.4 8.4(A)  Microalbumin 0.00 - 1.89 mg/dL - - - - -  Micro/Creat Ratio 0.0 - 30.0 mg/g - - - - -  Chol 0 - 200 mg/dL - - - - -  HDL >39 mg/dL - - - - -  Calc LDL - - - - - -  Triglycerides <150 mg/dL - - - - -  Creatinine 0.57 - 1.00 mg/dL 0.91 - - - -   BP/Weight 12/22/2020 11/28/2020 07/20/2020 07/06/2020 10/21/2019 06/25/2019 1/66/0630  Systolic BP - 160 109 323 557 322 025  Diastolic BP - 70 72 71 74 82 78  Wt. (Lbs) 158 158 158.12 156 161 161 162  BMI 27.12 27.12 27.14 26.78 27.64 27.64 27.81   Foot/eye exam completion dates 07/06/2020 06/25/2019  Foot Form Completion Done Done

## 2021-01-01 NOTE — Assessment & Plan Note (Signed)
Controlled, no change in medication  

## 2021-01-01 NOTE — Assessment & Plan Note (Signed)
Hyperlipidemia:Low fat diet discussed and encouraged.   Lipid Panel  Lab Results  Component Value Date   CHOL 265 (H) 02/03/2014   HDL 44 02/03/2014   LDLCALC 64 07/15/2019   TRIG 204 (H) 02/03/2014   CHOLHDL 6.0 02/03/2014   Updated lab needed

## 2021-01-04 ENCOUNTER — Ambulatory Visit: Payer: No Typology Code available for payment source | Admitting: Family Medicine

## 2021-01-26 ENCOUNTER — Encounter: Payer: Self-pay | Admitting: Family Medicine

## 2021-01-26 ENCOUNTER — Other Ambulatory Visit: Payer: Self-pay

## 2021-01-26 ENCOUNTER — Telehealth (INDEPENDENT_AMBULATORY_CARE_PROVIDER_SITE_OTHER): Payer: No Typology Code available for payment source | Admitting: Family Medicine

## 2021-01-26 VITALS — BP 125/70 | Ht 64.0 in | Wt 158.0 lb

## 2021-01-26 DIAGNOSIS — Z940 Kidney transplant status: Secondary | ICD-10-CM

## 2021-01-26 DIAGNOSIS — E1165 Type 2 diabetes mellitus with hyperglycemia: Secondary | ICD-10-CM

## 2021-01-26 DIAGNOSIS — E1121 Type 2 diabetes mellitus with diabetic nephropathy: Secondary | ICD-10-CM | POA: Diagnosis not present

## 2021-01-26 DIAGNOSIS — E663 Overweight: Secondary | ICD-10-CM

## 2021-01-26 DIAGNOSIS — F322 Major depressive disorder, single episode, severe without psychotic features: Secondary | ICD-10-CM

## 2021-01-26 DIAGNOSIS — Z1231 Encounter for screening mammogram for malignant neoplasm of breast: Secondary | ICD-10-CM

## 2021-01-26 DIAGNOSIS — IMO0002 Reserved for concepts with insufficient information to code with codable children: Secondary | ICD-10-CM

## 2021-01-26 DIAGNOSIS — I10 Essential (primary) hypertension: Secondary | ICD-10-CM

## 2021-01-26 MED ORDER — GLIPIZIDE ER 5 MG PO TB24
5.0000 mg | ORAL_TABLET | Freq: Every day | ORAL | 3 refills | Status: DC
Start: 2021-01-26 — End: 2021-01-26

## 2021-01-26 MED ORDER — GLIPIZIDE ER 5 MG PO TB24
5.0000 mg | ORAL_TABLET | Freq: Every day | ORAL | 3 refills | Status: DC
Start: 2021-01-26 — End: 2022-01-15

## 2021-01-26 MED ORDER — GLIPIZIDE ER 10 MG PO TB24: 10.0000 mg | ORAL_TABLET | Freq: Every day | ORAL | 3 refills | Status: AC

## 2021-01-26 NOTE — Patient Instructions (Addendum)
F/U end August, flu vaccine at visit , call if you need me before  Please get non fasting HBA1C, chem 7 and EGFr 04/20 or shortly after  Please schedule mammogram at [facility] at checkout, on a Tuesday through Thursday please   Michaela [name], please DO make and keep an appointment with the diabetic educator  Blood sugar sounds much improved,looking out for your HBA1C in April!  Fasting lipid , cmp and EGFR and HB1C 1 week before August apointment     Diabetes Mellitus and Nutrition, Adult When you have diabetes, or diabetes mellitus, it is very important to have healthy eating habits because your blood sugar (glucose) levels are greatly affected by what you eat and drink. Eating healthy foods in the right amounts, at about the same times every day, can help you:  Control your blood glucose.  Lower your risk of heart disease.  Improve your blood pressure.  Reach or maintain a healthy weight. What can affect my meal plan? Every person with diabetes is different, and each person has different needs for a meal plan. Your health care provider may recommend that you work with a dietitian to make a meal plan that is best for you. Your meal plan may vary depending on factors such as:  The calories you need.  The medicines you take.  Your weight.  Your blood glucose, blood pressure, and cholesterol levels.  Your activity level.  Other health conditions you have, such as heart or kidney disease. How do carbohydrates affect me? Carbohydrates, also called carbs, affect your blood glucose level more than any other type of food. Eating carbs naturally raises the amount of glucose in your blood. Carb counting is a method for keeping track of how many carbs you eat. Counting carbs is important to keep your blood glucose at a healthy level, especially if you use insulin or take certain oral diabetes medicines. It is important to know how many carbs you can safely have in each meal. This is  different for every person. Your dietitian can help you calculate how many carbs you should have at each meal and for each snack. How does alcohol affect me? Alcohol can cause a sudden decrease in blood glucose (hypoglycemia), especially if you use insulin or take certain oral diabetes medicines. Hypoglycemia can be a life-threatening condition. Symptoms of hypoglycemia, such as sleepiness, dizziness, and confusion, are similar to symptoms of having too much alcohol.  Do not drink alcohol if: ? Your health care provider tells you not to drink. ? You are pregnant, may be pregnant, or are planning to become pregnant.  If you drink alcohol: ? Do not drink on an empty stomach. ? Limit how much you use to:  0-1 drink a day for women.  0-2 drinks a day for men. ? Be aware of how much alcohol is in your drink. In the [place] one drink equals one 12 oz bottle of beer (355 mL), one 5 oz glass of wine (148 mL), or one 1 oz glass of hard liquor (44 mL). ? Keep yourself hydrated with water, diet soda, or unsweetened iced tea.  Keep in mind that regular soda, juice, and other mixers may contain a lot of sugar and must be counted as carbs. What are tips for following this plan? Reading food labels  Start by checking the serving size on the "Nutrition Facts" label of packaged foods and drinks. The amount of calories, carbs, fats, and other nutrients listed on the label is  based on one serving of the item. Many items contain more than one serving per package.  Check the total grams (g) of carbs in one serving. You can calculate the number of servings of carbs in one serving by dividing the total carbs by 15. For example, if a food has 30 g of total carbs per serving, it would be equal to 2 servings of carbs.  Check the number of grams (g) of saturated fats and trans fats in one serving. Choose foods that have a low amount or none of these fats.  Check the number of milligrams (mg) of salt (sodium) in one  serving. Most people should limit total sodium intake to less than 2,300 mg per day.  Always check the nutrition information of foods labeled as "low-fat" or "nonfat." These foods may be higher in added sugar or refined carbs and should be avoided.  Talk to your dietitian to identify your daily goals for nutrients listed on the label. Shopping  Avoid buying canned, pre-made, or processed foods. These foods tend to be high in fat, sodium, and added sugar.  Shop around the outside edge of the grocery store. This is where you will most often find fresh fruits and vegetables, bulk grains, fresh meats, and fresh dairy. Cooking  Use low-heat cooking methods, such as baking, instead of high-heat cooking methods like deep frying.  Cook using healthy oils, such as olive, canola, or sunflower oil.  Avoid cooking with butter, cream, or high-fat meats. Meal planning  Eat meals and snacks regularly, preferably at the same times every day. Avoid going long periods of time without eating.  Eat foods that are high in fiber, such as fresh fruits, vegetables, beans, and whole grains. Talk with your dietitian about how many servings of carbs you can eat at each meal.  Eat 4-6 oz (112-168 g) of lean protein each day, such as lean meat, chicken, fish, eggs, or tofu. One ounce (oz) of lean protein is equal to: ? 1 oz (28 g) of meat, chicken, or fish. ? 1 egg. ?  cup (62 g) of tofu.  Eat some foods each day that contain healthy fats, such as avocado, nuts, seeds, and fish.   What foods should I eat? Fruits Berries. Apples. Oranges. Peaches. Apricots. Plums. Grapes. Mango. Papaya. Pomegranate. Kiwi. Cherries. Vegetables Lettuce. Spinach. Leafy greens, including kale, chard, collard greens, and mustard greens. Beets. Cauliflower. Cabbage. Broccoli. Carrots. Green beans. Tomatoes. Peppers. Onions. Cucumbers. Brussels sprouts. Grains Whole grains, such as whole-wheat or whole-grain bread, crackers,  tortillas, cereal, and pasta. Unsweetened oatmeal. Quinoa. Brown or wild rice. Meats and other proteins Seafood. Poultry without skin. Lean cuts of poultry and beef. Tofu. Nuts. Seeds. Dairy Low-fat or fat-free dairy products such as milk, yogurt, and cheese. The items listed above may not be a complete list of foods and beverages you can eat. Contact a dietitian for more information. What foods should I avoid? Fruits Fruits canned with syrup. Vegetables Canned vegetables. Frozen vegetables with butter or cream sauce. Grains Refined white flour and flour products such as bread, pasta, snack foods, and cereals. Avoid all processed foods. Meats and other proteins Fatty cuts of meat. Poultry with skin. Breaded or fried meats. Processed meat. Avoid saturated fats. Dairy Full-fat yogurt, cheese, or milk. Beverages Sweetened drinks, such as soda or iced tea. The items listed above may not be a complete list of foods and beverages you should avoid. Contact a dietitian for more information. Questions to ask a health  care provider  Do I need to meet with a diabetes educator?  Do I need to meet with a dietitian?  What number can I call if I have questions?  When are the best times to check my blood glucose? Where to find more information:  American Diabetes Association: diabetes.org  Academy of Nutrition and Dietetics: www.eatright.[employer] of Diabetes and Digestive and Kidney Diseases: [employer]  Association of Diabetes Care and Education Specialists: www.diabeteseducator.org Summary  It is important to have healthy eating habits because your blood sugar (glucose) levels are greatly affected by what you eat and drink.  A healthy meal plan will help you control your blood glucose and maintain a healthy lifestyle.  Your health care provider may recommend that you work with a dietitian to make a meal plan that is best for you.  Keep in mind that carbohydrates  (carbs) and alcohol have immediate effects on your blood glucose levels. It is important to count carbs and to use alcohol carefully. This information is not intended to replace advice given to you by your health care provider. Make sure you discuss any questions you have with your health care provider. Document Revised: 10/20/2019 Document Reviewed: 10/20/2019 Elsevier Patient Education  2021 [employer].

## 2021-01-26 NOTE — Progress Notes (Signed)
Virtual Visit via Telephone Note  I connected with Michaela [name] on 01/26/21 at  2:20 PM EST by telephone and verified that I am speaking with the correct person using two identifiers.  Location: Patient: home Provider: office   I discussed the limitations, risks, security and privacy concerns of performing an evaluation and management service by telephone and the availability of in person appointments. I also discussed with the patient that there may be a patient responsible charge related to this service. The patient expressed understanding and agreed to proceed.   History of Present Illness: Testing sugar twice daily,fasting  range is from 100 to 120, evening around 180 Denies polyuria, polydipsia, blurred vision , or hypoglycemic episodes. Denies recent fever or chills. Denies sinus pressure, nasal congestion, ear pain or sore throat. Denies chest congestion, productive cough or wheezing. Denies chest pains, palpitations and leg swelling Denies abdominal pain, nausea, vomiting,diarrhea or constipation.   Denies dysuria, frequency, hesitancy or incontinence. Denies joint pain, swelling and limitation in mobility. Denies headaches, seizures, numbness, or tingling. Denies depression, anxiety or insomnia. Denies skin break down or rash.        Observations/Objective: BP 125/70   Ht 5\' 4"  (1.626 m)   Wt 158 lb (71.7 kg)   BMI 27.12 kg/m   Good communication with no confusion and intact memory. 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   The need  to take medication as prescribed, test blood sugar as directed, and to call between visits if there is a concern that blood sugar is uncontrolled is also discussed.   Michaela [name] is reminded of the importance of daily foot exam, annual eye examination, and good blood sugar, blood pressure and cholesterol control.  Diabetic Labs Latest Ref Rng & Units 12/14/2020 07/06/2020 07/06/2020 07/06/2020 07/06/2020  HbA1c 4.8 - 5.6 % 8.6(H) 8.4(A) 8.4(A) 8.4 8.4(A)  Microalbumin 0.00 - 1.89 mg/dL - - - - -  Micro/Creat Ratio 0.0 - 30.0 mg/g - - - - -  Chol 0 - 200 mg/dL - - - - -  HDL >39 mg/dL - - - - -  Calc LDL - - - - - -  Triglycerides <150 mg/dL - - - - -  Creatinine 0.57 - 1.00 mg/dL 0.91 - - - -   BP/Weight 01/26/2021 12/22/2020 11/28/2020 07/20/2020 07/06/2020 10/21/2019 4/74/2595  Systolic BP 638 - 756 433 295 188 416  Diastolic BP 40 - 70 72 71 74 82  Wt. (Lbs) 158 158 158 158.12 156 161 161  BMI 27.12 27.12 27.12 27.14 26.78 27.64 27.64   Foot/eye exam completion dates 07/06/2020 06/25/2019  Foot Form Completion Done Done        Depression, major, single episode, severe (HCC) Controlled, no change in medication Receives therapy through the [place]  Essential hypertension Controlled, no change in medication DASH diet and commitment to daily physical activity for a minimum of 30 minutes discussed and encouraged, as a part of hypertension management. The importance of attaining a healthy weight is also discussed.  BP/Weight 01/26/2021 12/22/2020  11/28/2020 07/20/2020 07/06/2020 10/21/2019 3/83/8184  Systolic BP 037 - 543 606 770 340 352  Diastolic BP 70 - 70 72 71 74 82  Wt. (Lbs) 158 158 158 158.12 156 161 161  BMI 27.12 27.12 27.12 27.14 26.78 27.64 27.64          Overweight (BMI 25.0-29.9)  Patient re-educated about  the importance of commitment to a  minimum of 150 minutes of exercise per week as able.  The importance of healthy food choices with portion control discussed, as well as  eating regularly and within a 12 hour window most days. The need to choose "clean , green" food 50 to 75% of the time is discussed, as well as to make water the primary drink and set a goal of 64 ounces water daily.    Weight /BMI 01/26/2021 12/22/2020 11/28/2020  WEIGHT 158 lb 158 lb 158 lb  HEIGHT 5\' 4"  5\' 4"  5\' 4"   BMI 27.12 kg/m2 27.12 kg/m2 27.12 kg/m2        Follow Up Instructions:    I discussed the assessment and treatment plan with the patient. The patient was provided an opportunity to ask questions and all were answered. The patient agreed with the plan and demonstrated an understanding of the instructions.   The patient was advised to call back or seek an in-person evaluation if the symptoms worsen or if the condition fails to improve as anticipated.  I provided 22 minutes of non-face-to-face time during this encounter.   Tula Nakayama, MD

## 2021-01-27 ENCOUNTER — Encounter: Payer: Self-pay | Admitting: Family Medicine

## 2021-01-27 NOTE — Assessment & Plan Note (Signed)
Controlled, no change in medication Receives therapy through the [place]

## 2021-01-27 NOTE — Assessment & Plan Note (Signed)
  Patient re-educated about  the importance of commitment to a  minimum of 150 minutes of exercise per week as able.  The importance of healthy food choices with portion control discussed, as well as eating regularly and within a 12 hour window most days. The need to choose "clean , green" food 50 to 75% of the time is discussed, as well as to make water the primary drink and set a goal of 64 ounces water daily.    Weight /BMI 01/26/2021 12/22/2020 11/28/2020  WEIGHT 158 lb 158 lb 158 lb  HEIGHT 5\' 4"  5\' 4"  5\' 4"   BMI 27.12 kg/m2 27.12 kg/m2 27.12 kg/m2

## 2021-01-27 NOTE — Assessment & Plan Note (Signed)
Marked improvement based on pt reporting , rept hBa1C in April when due, if good next visit in 06/2021 Michaela [name] is reminded of the importance of commitment to daily physical activity for 30 minutes or more, as able and the need to limit carbohydrate intake to 30 to 60 grams per meal to help with blood sugar control.   The need to take medication as prescribed, test blood sugar as directed, and to call between visits if there is a concern that blood sugar is uncontrolled is also discussed.   Michaela [name] is reminded of the importance of daily foot exam, annual eye examination, and good blood sugar, blood pressure and cholesterol control.  Diabetic Labs Latest Ref Rng & Units 12/14/2020 07/06/2020 07/06/2020 07/06/2020 07/06/2020  HbA1c 4.8 - 5.6 % 8.6(H) 8.4(A) 8.4(A) 8.4 8.4(A)  Microalbumin 0.00 - 1.89 mg/dL - - - - -  Micro/Creat Ratio 0.0 - 30.0 mg/g - - - - -  Chol 0 - 200 mg/dL - - - - -  HDL >39 mg/dL - - - - -  Calc LDL - - - - - -  Triglycerides <150 mg/dL - - - - -  Creatinine 0.57 - 1.00 mg/dL 0.91 - - - -   BP/Weight 01/26/2021 12/22/2020 11/28/2020 07/20/2020 07/06/2020 10/21/2019 7/93/9030  Systolic BP 092 - 330 076 226 333 545  Diastolic BP 40 - 70 72 71 74 82  Wt. (Lbs) 158 158 158 158.12 156 161 161  BMI 27.12 27.12 27.12 27.14 26.78 27.64 27.64   Foot/eye exam completion dates 07/06/2020 06/25/2019  Foot Form Completion Done Done

## 2021-01-27 NOTE — Assessment & Plan Note (Addendum)
Controlled, no change in medication DASH diet and commitment to daily physical activity for a minimum of 30 minutes discussed and encouraged, as a part of hypertension management. The importance of attaining a healthy weight is also discussed.  BP/Weight 01/26/2021 12/22/2020 11/28/2020 07/20/2020 07/06/2020 10/21/2019 05/23/6380  Systolic BP 771 - 165 790 383 338 329  Diastolic BP 70 - 70 72 71 74 82  Wt. (Lbs) 158 158 158 158.12 156 161 161  BMI 27.12 27.12 27.12 27.14 26.78 27.64 27.64

## 2021-03-13 ENCOUNTER — Ambulatory Visit: Payer: No Typology Code available for payment source | Admitting: Podiatry

## 2021-03-21 ENCOUNTER — Inpatient Hospital Stay: Admission: RE | Admit: 2021-03-21 | Payer: No Typology Code available for payment source | Source: Ambulatory Visit

## 2021-03-25 IMAGING — US US RENAL
1 series · 14 of 25 positions shown · non-contrast
Comparison: 06/19/2006

CLINICAL DATA: Three-week history of LEFT flank pain, has a renal
transplant (RLQ, September 2012)

EXAM:
RENAL / URINARY TRACT ULTRASOUND COMPLETE

[Series 1: us renal · 14 of 62 slices shown]
[im 1/62]
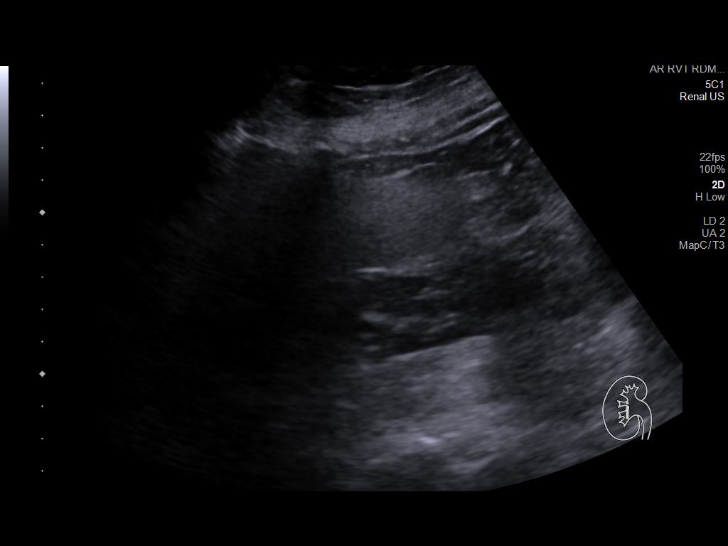
[im 6/62]
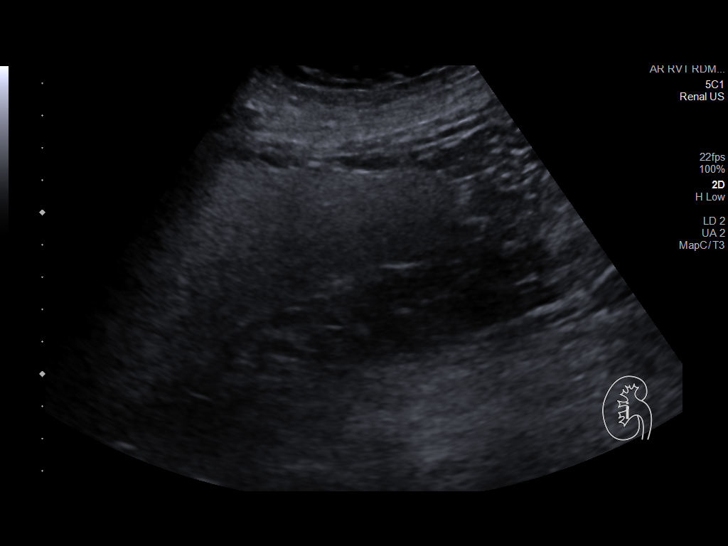
[im 11/62]
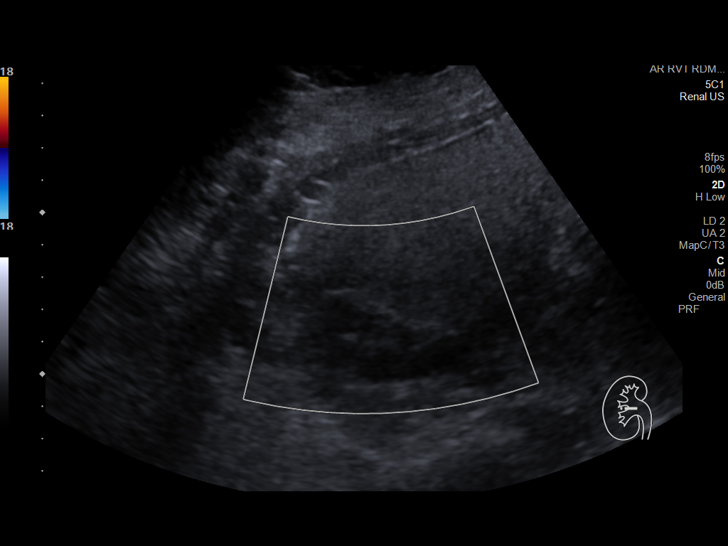
[im 16/62]
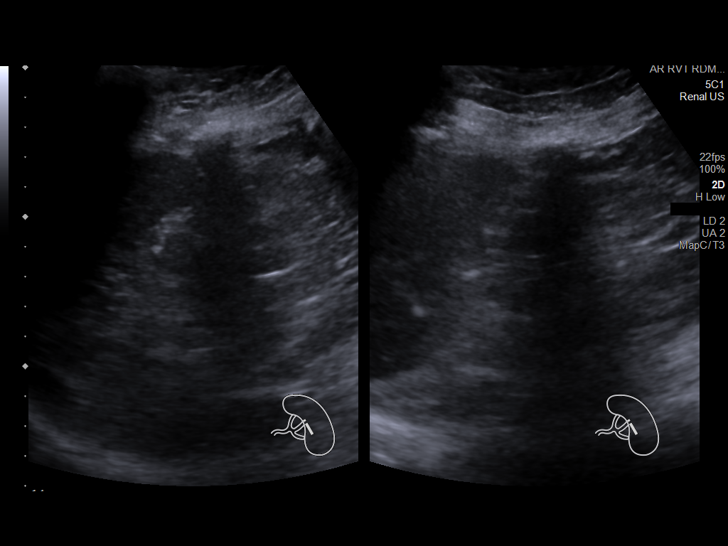
[im 21/62]
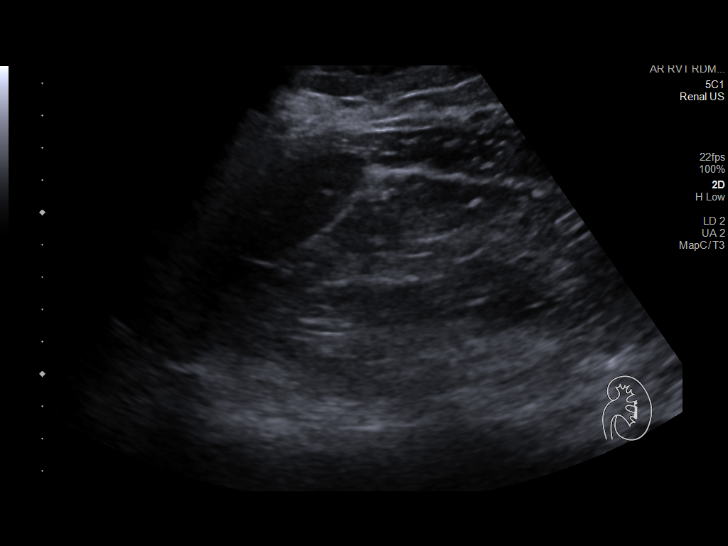
[im 23/62]
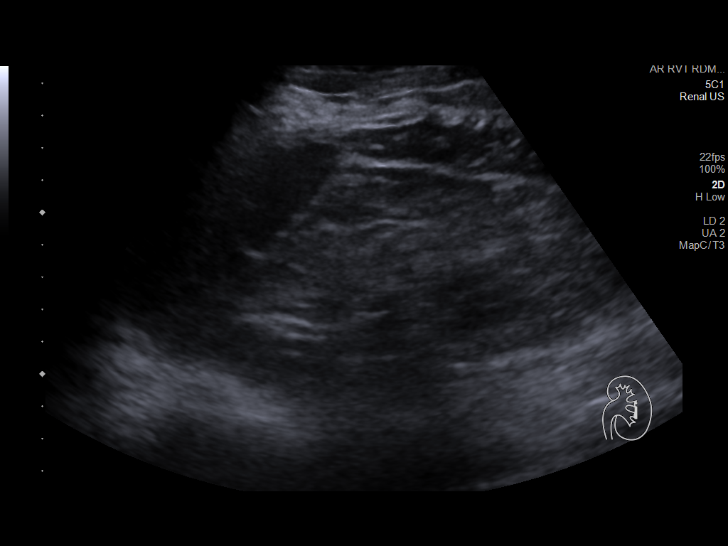
[im 28/62]
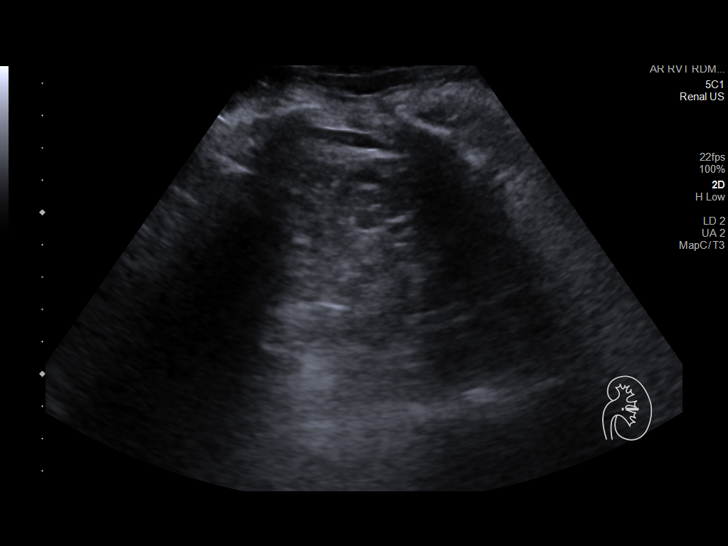
[im 34/62]
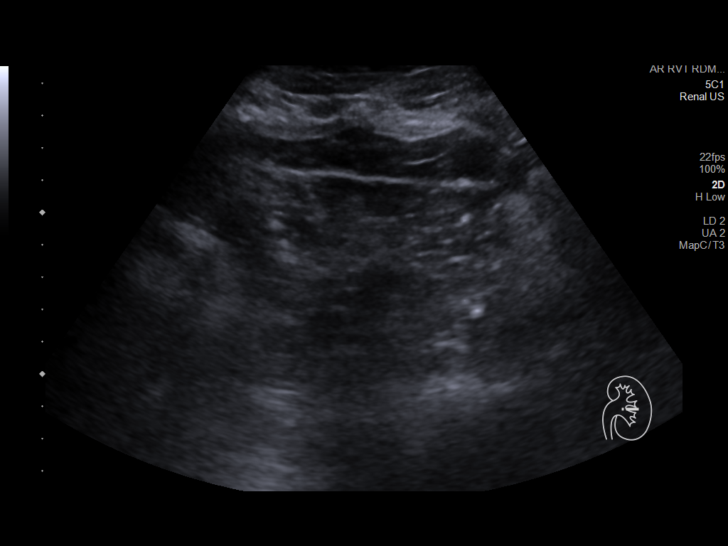
[im 39/62]
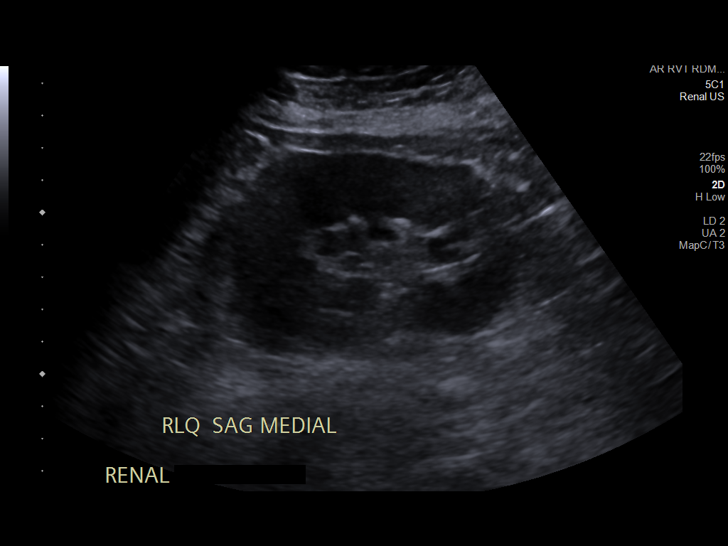
[im 41/62]
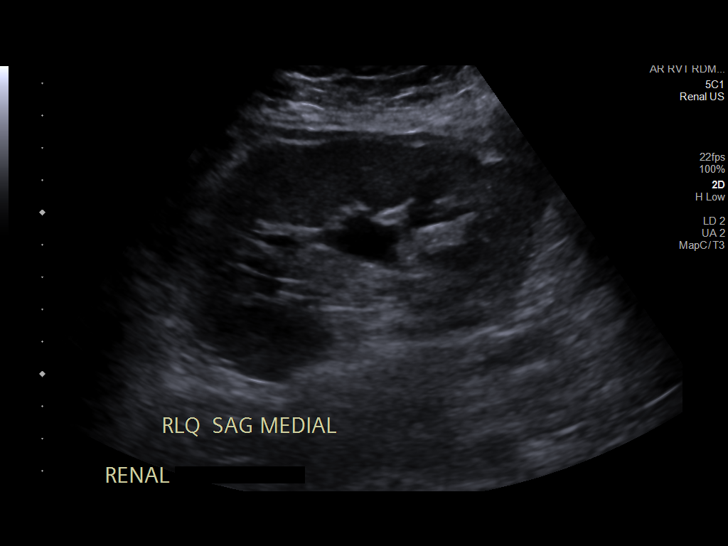
[im 46/62]
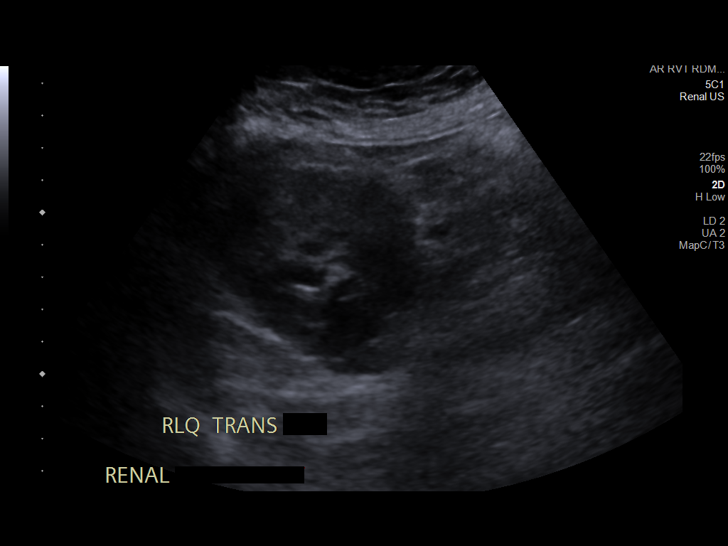
[im 51/62]
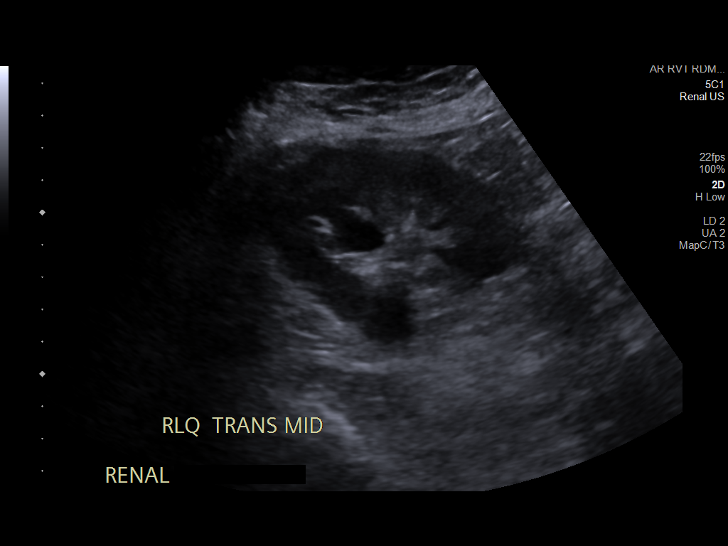
[im 56/62]
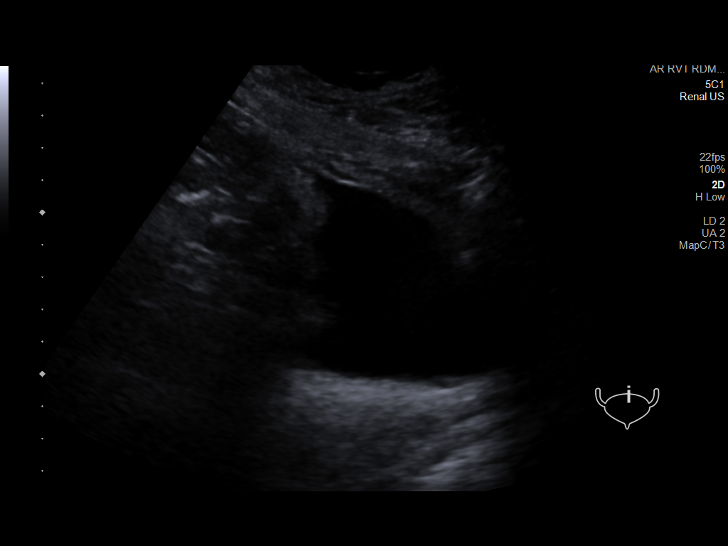
[im 62/62]
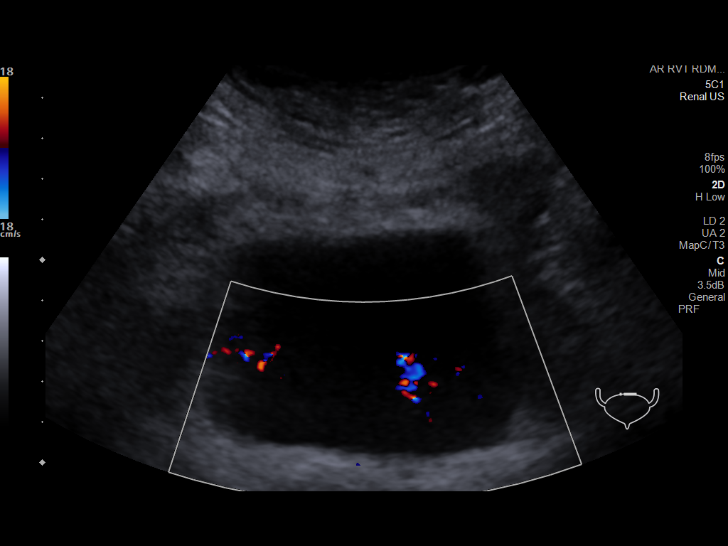

[14 of 25 positions shown; findings below may reference images not displayed]

FINDINGS: Right Kidney:

Renal measurements: 6.9 x 3.2 x 3.7 cm = volume: 42 mL. Atrophic
native kidney with cortical thinning and slightly increased
echogenicity. No mass or hydronephrosis.

Left Kidney:

Renal measurements: 8.9 x 4.8 x 3.3 cm = volume: 73 mL. Atrophic
LEFT kidney with cortical thinning and increased cortical
echogenicity. No mass or hydronephrosis

Bladder:

Appears normal for degree of bladder distention.

Other:

Transplant kidney RIGHT iliac fossa measuring 7.3 x 6.4 x 2.6 cm,
corresponding to a volume of 310 mL. Normal cortical thickness and
echogenicity. No mass or calcification. Mild central renal
collecting system dilatation of uncertain acuity.
IMPRESSION: Atrophic native kidneys without mass or hydronephrosis.

Mild dilatation of central renal collecting system of the transplant
kidney in RIGHT iliac fossa.

## 2021-06-22 ENCOUNTER — Ambulatory Visit: Payer: No Typology Code available for payment source | Admitting: Family Medicine

## 2021-07-24 ENCOUNTER — Telehealth: Payer: Self-pay

## 2021-07-24 NOTE — Telephone Encounter (Signed)
Patient just wanted Dr Moshe Cipro to know she had pneumonia  back in June and she had the shot last year, why get pneumonia. Tell [name] hello.

## 2021-07-25 NOTE — Telephone Encounter (Signed)
What do you want me to tell her? That the vaccine doesn't guarantee you won't get pneumonia but that it won't be as severe as it could be?

## 2021-07-28 NOTE — Telephone Encounter (Signed)
Called pt back several times- mailbox is full and cannot accept messages

## 2021-09-26 ENCOUNTER — Ambulatory Visit: Payer: No Typology Code available for payment source | Admitting: Family Medicine

## 2021-10-11 ENCOUNTER — Ambulatory Visit: Payer: No Typology Code available for payment source | Admitting: Family Medicine

## 2021-10-13 ENCOUNTER — Ambulatory Visit: Payer: No Typology Code available for payment source | Admitting: Family Medicine

## 2021-12-22 ENCOUNTER — Ambulatory Visit: Payer: No Typology Code available for payment source | Admitting: Family Medicine

## 2022-01-11 ENCOUNTER — Encounter (INDEPENDENT_AMBULATORY_CARE_PROVIDER_SITE_OTHER): Payer: Self-pay

## 2022-01-11 ENCOUNTER — Encounter: Payer: Self-pay | Admitting: Family Medicine

## 2022-01-11 ENCOUNTER — Other Ambulatory Visit: Payer: Self-pay

## 2022-01-11 ENCOUNTER — Ambulatory Visit (INDEPENDENT_AMBULATORY_CARE_PROVIDER_SITE_OTHER): Payer: Self-pay | Admitting: Family Medicine

## 2022-01-11 ENCOUNTER — Ambulatory Visit: Payer: No Typology Code available for payment source

## 2022-01-11 VITALS — BP 118/70 | HR 61 | Ht 64.0 in | Wt 153.0 lb

## 2022-01-11 DIAGNOSIS — E1165 Type 2 diabetes mellitus with hyperglycemia: Secondary | ICD-10-CM

## 2022-01-11 DIAGNOSIS — E663 Overweight: Secondary | ICD-10-CM

## 2022-01-11 DIAGNOSIS — J302 Other seasonal allergic rhinitis: Secondary | ICD-10-CM

## 2022-01-11 DIAGNOSIS — I10 Essential (primary) hypertension: Secondary | ICD-10-CM

## 2022-01-11 DIAGNOSIS — F418 Other specified anxiety disorders: Secondary | ICD-10-CM

## 2022-01-11 DIAGNOSIS — Z1231 Encounter for screening mammogram for malignant neoplasm of breast: Secondary | ICD-10-CM

## 2022-01-11 DIAGNOSIS — E785 Hyperlipidemia, unspecified: Secondary | ICD-10-CM

## 2022-01-11 DIAGNOSIS — Z5900 Homelessness unspecified: Secondary | ICD-10-CM

## 2022-01-11 DIAGNOSIS — Z794 Long term (current) use of insulin: Secondary | ICD-10-CM

## 2022-01-11 LAB — POCT GLYCOSYLATED HEMOGLOBIN (HGB A1C): HbA1c, POC (controlled diabetic range): 7.8 % — AB (ref 0.0–7.0)

## 2022-01-11 MED ORDER — INSULIN GLARGINE 100 UNITS/ML SOLOSTAR PEN: 35.0000 [IU] | PEN_INJECTOR | Freq: Every day | SUBCUTANEOUS | 3 refills | Status: DC

## 2022-01-11 MED ORDER — INSULIN GLARGINE 100 UNITS/ML SOLOSTAR PEN
35.0000 [IU] | PEN_INJECTOR | Freq: Every day | SUBCUTANEOUS | 3 refills | Status: DC
Start: 2022-01-11 — End: 2022-01-25

## 2022-01-11 NOTE — Patient Instructions (Addendum)
F/u in 3 months, call if you need me sooner, foot exam at visit ° °Please add  medicare card at checkout ° °Increase lantus to 35 units daILY AND STAY ON SAME DOSE OF GLIPIZIDE AND METFORMIN ( nurse please send in new higher dose of lantus) ° °Fasting lipid, cmp and eGFR, tSH, vitD and cBC tomorrow please, also microalb ° °Please schedule mammogram at checkout ° °Check pharmacy for your TdAP ° °You are referred to pharmacist and to social worker ° ° °

## 2022-01-11 NOTE — Progress Notes (Signed)
Michaela [name]     MRN: [id number]      DOB: 25-Oct-1956   HPI Michaela [name] is here for follow up and re-evaluation of chronic medical conditions, medication management and review of any available recent lab and radiology data.  Preventive health is updated, specifically  Cancer screening and Immunization.   Hospitalized at the [facility] in [place] for 3 days.approx 1/11 to 1/14 2023, with sinusitis, April 29, 20222 hospitalized with covid and pneumonia x 5 dayas The PT denies any adverse reactions to current medications since the last visit.  Requests help with housing, separated from spous e  int the past year, health insurance coverage has changed, and she has  housing needs which she is finacially strapped to fill, requesting help Has been receiving therapy every 2 weeks though the [facility] which is extremely beneficial ROS Denies recent fever or chills. Denies sinus pressure, nasal congestion, ear pain or sore throat. Denies chest congestion, productive cough or wheezing. Denies chest pains, palpitations and leg swelling Denies abdominal pain, nausea, vomiting,diarrhea or constipation.   Denies dysuria, frequency, hesitancy or incontinence. Denies joint pain, swelling and limitation in mobility. Denies headaches, seizures, numbness, or tingling. Denies uncontrolled  depression, anxiety or insomnia. Denies skin break down or rash.   PE  BP 125/67 (BP Location: Right Arm, Patient Position: Sitting, Cuff Size: Normal)    Pulse 61    Ht 5\' 4"  (1.626 m)    Wt 153 lb (69.4 kg)    SpO2 100%    BMI 26.26 kg/m   Patient alert and oriented and in no cardiopulmonary distress.  HEENT: No facial asymmetry, EOMI,     Neck supple .  Chest: Clear to auscultation bilaterally.  CVS: S1, S2 no murmurs, no S3.Regular rate.  ABD: Soft non tender.   Ext: No edema  MS: Adequate ROM spine, shoulders, hips and knees.  Skin: Intact, no ulcerations or rash noted.  Psych: Good eye contact, normal affect.  Memory intact not anxious or depressed appearing.  CNS: CN 2-12 intact, power,  normal throughout.no focal deficits noted.   Assessment & Plan  Uncontrolled type 2 diabetes mellitus with nephropathy ([place]) Michaela [name] is reminded of the importance of commitment to daily physical activity for 30 minutes or more, as able and the need to limit carbohydrate intake to 30 to 60 grams per meal to help with blood sugar control.   The need to take medication as prescribed, test blood sugar as directed, and to call between visits if there is a concern that blood sugar is uncontrolled is also discussed.   Michaela [name] is reminded of the importance of daily foot exam, annual eye examination, and good blood sugar, blood pressure and cholesterol control. Uncontrolled increase insulin dose, refer p clinical pharmacist for med manaement  Diabetic Labs Latest Ref Rng & Units 01/12/2022 01/11/2022 12/14/2020 07/06/2020 07/06/2020  HbA1c 0.0 - 7.0 % - 7.8(A) 8.6(H) 8.4(A) 8.4(A)  Microalbumin 0.00 - 1.89 mg/dL - - - - -  Micro/Creat Ratio 0 - 29 mg/g creat 5 - - - -  Chol 100 - 199 mg/dL 162 - - - -  HDL >39 mg/dL 49 - - - -  Calc LDL 0 - 99 mg/dL 94 - - - -  Triglycerides 0 - 149 mg/dL 107 - - - -  Creatinine 0.57 - 1.00 mg/dL 1.05(H) - 0.91 - -   BP/Weight 01/11/2022 01/26/2021 12/22/2020 11/28/2020 07/20/2020 07/06/2020 44/81/8563  Systolic BP 149 702 - 637  680 881 103  Diastolic BP 70 70 - 70 72 71 74  Wt. (Lbs) 153 158 158 158 158.12 156 161  BMI 26.26 27.12 27.12 27.12 27.14 26.78 27.64   Foot/eye exam completion dates 07/06/2020 06/25/2019  Foot Form Completion Done Done        Hyperlipidemia LDL goal <100 Hyperlipidemia:Low fat diet discussed and encouraged.   Lipid Panel  Lab Results  Component Value Date   CHOL 162 01/12/2022   HDL 49 01/12/2022   LDLCALC 94 01/12/2022   TRIG 107 01/12/2022   CHOLHDL 3.3 01/12/2022     Controlled, no change in medication   Essential hypertension DASH diet  and commitment to daily physical activity for a minimum of 30 minutes discussed and encouraged, as a part of hypertension management. The importance of attaining a healthy weight is also discussed.  BP/Weight 01/11/2022 01/26/2021 12/22/2020 11/28/2020 07/20/2020 07/06/2020 15/94/5859  Systolic BP 292 446 - 286 381 771 165  Diastolic BP 70 70 - 70 72 71 74  Wt. (Lbs) 153 158 158 158 158.12 156 161  BMI 26.26 27.12 27.12 27.12 27.14 26.78 27.64     Controlled, no change in medication   Depression with anxiety Managed by psychiatry , receiving therapy regularly  Allergic rhinitis Controlled, no change in medication   Overweight (BMI 25.0-29.9)  Patient re-educated about  the importance of commitment to a  minimum of 150 minutes of exercise per week as able.  The importance of healthy food choices with portion control discussed, as well as eating regularly and within a 12 hour window most days. The need to choose "clean , green" food 50 to 75% of the time is discussed, as well as to make water the primary drink and set a goal of 64 ounces water daily.    Weight /BMI 01/11/2022 01/26/2021 12/22/2020  WEIGHT 153 lb 158 lb 158 lb  HEIGHT 5\' 4"  5\' 4"  5\' 4"   BMI 26.26 kg/m2 27.12 kg/m2 27.12 kg/m2

## 2022-01-12 ENCOUNTER — Telehealth: Payer: Self-pay

## 2022-01-12 ENCOUNTER — Telehealth: Payer: Self-pay | Admitting: *Deleted

## 2022-01-12 NOTE — Telephone Encounter (Signed)
This patient has a Referral for [facility] - she has [facility].  I have marked this as outside referral.  Can you please follow up and get approval for [facility] to see [facility]?  Thanks

## 2022-01-12 NOTE — Chronic Care Management (AMB) (Signed)
°  Care Management   Outreach Note  01/12/2022 Name: Michaela [name] MRN: [id number] DOB: 1955-12-24  Referred by: Fayrene Helper, MD Reason for referral : Care Coordination (Initial outreach to schedule referral with PharmD)   An unsuccessful telephone outreach was attempted today. The patient was referred to the case management team for assistance with care management and care coordination.   Follow Up Plan:  The care management team will reach out to the patient again over the next 7 days.  If patient returns call to provider office, please advise to call [facility]* at [phone].*  [facility]  Direct Dial: [phone]

## 2022-01-13 MED ORDER — ERGOCALCIFEROL 1.25 MG (50000 UT) PO CAPS: 50000.0000 [IU] | ORAL_CAPSULE | ORAL | 2 refills | Status: DC

## 2022-01-14 LAB — MICROALBUMIN / CREATININE URINE RATIO
Creatinine, Urine: 125.9 mg/dL
Microalb/Creat Ratio: 5 mg/g creat (ref 0–29)
Microalbumin, Urine: 6.7 ug/mL

## 2022-01-14 LAB — CMP14+EGFR
ALT: 16 IU/L (ref 0–32)
AST: 18 IU/L (ref 0–40)
Albumin/Globulin Ratio: 1.6 (ref 1.2–2.2)
Albumin: 4.5 g/dL (ref 3.8–4.8)
Alkaline Phosphatase: 50 IU/L (ref 44–121)
BUN/Creatinine Ratio: 15 (ref 12–28)
BUN: 16 mg/dL (ref 8–27)
Bilirubin Total: 0.5 mg/dL (ref 0.0–1.2)
CO2: 21 mmol/L (ref 20–29)
Calcium: 10 mg/dL (ref 8.7–10.3)
Chloride: 106 mmol/L (ref 96–106)
Creatinine, Ser: 1.05 mg/dL — ABNORMAL HIGH (ref 0.57–1.00)
Globulin, Total: 2.8 g/dL (ref 1.5–4.5)
Glucose: 68 mg/dL — ABNORMAL LOW (ref 70–99)
Potassium: 3.7 mmol/L (ref 3.5–5.2)
Sodium: 142 mmol/L (ref 134–144)
Total Protein: 7.3 g/dL (ref 6.0–8.5)
eGFR: 59 mL/min/{1.73_m2} — ABNORMAL LOW (ref >59)

## 2022-01-14 LAB — CBC
Hematocrit: 35.1 % (ref 34.0–46.6)
Hemoglobin: 11.4 g/dL (ref 11.1–15.9)
MCH: 26.7 pg (ref 26.6–33.0)
MCHC: 32.5 g/dL (ref 31.5–35.7)
MCV: 82 fL (ref 79–97)
Platelets: 151 10*3/uL (ref 150–450)
RBC: 4.27 x10E6/uL (ref 3.77–5.28)
RDW: 14.2 % (ref 11.7–15.4)
WBC: 7.1 10*3/uL (ref 3.4–10.8)

## 2022-01-14 LAB — LIPID PANEL
Chol/HDL Ratio: 3.3 ratio (ref 0.0–4.4)
Cholesterol, Total: 162 mg/dL (ref 100–199)
HDL: 49 mg/dL (ref >39)
LDL Chol Calc (NIH): 94 mg/dL (ref 0–99)
Triglycerides: 107 mg/dL (ref 0–149)
VLDL Cholesterol Cal: 19 mg/dL (ref 5–40)

## 2022-01-14 LAB — VITAMIN D 25 HYDROXY (VIT D DEFICIENCY, FRACTURES): Vit D, 25-Hydroxy: 15.5 ng/mL — ABNORMAL LOW (ref 30.0–100.0)

## 2022-01-14 LAB — TSH: TSH: 0.732 u[IU]/mL (ref 0.450–4.500)

## 2022-01-15 ENCOUNTER — Telehealth: Payer: Self-pay

## 2022-01-15 ENCOUNTER — Encounter: Payer: Self-pay | Admitting: Family Medicine

## 2022-01-15 NOTE — Assessment & Plan Note (Signed)
°  Patient re-educated about  the importance of commitment to a  minimum of 150 minutes of exercise per week as able.  The importance of healthy food choices with portion control discussed, as well as eating regularly and within a 12 hour window most days. The need to choose "clean , green" food 50 to 75% of the time is discussed, as well as to make water the primary drink and set a goal of 64 ounces water daily.    Weight /BMI 01/11/2022 01/26/2021 12/22/2020  WEIGHT 153 lb 158 lb 158 lb  HEIGHT 5\' 4"  5\' 4"  5\' 4"   BMI 26.26 kg/m2 27.12 kg/m2 27.12 kg/m2

## 2022-01-15 NOTE — Assessment & Plan Note (Signed)
DASH diet and commitment to daily physical activity for a minimum of 30 minutes discussed and encouraged, as a part of hypertension management. The importance of attaining a healthy weight is also discussed.  BP/Weight 01/11/2022 01/26/2021 12/22/2020 11/28/2020 07/20/2020 07/06/2020 30/07/2329  Systolic BP 076 226 - 333 545 625 638  Diastolic BP 70 70 - 70 72 71 74  Wt. (Lbs) 153 158 158 158 158.12 156 161  BMI 26.26 27.12 27.12 27.12 27.14 26.78 27.64     Controlled, no change in medication

## 2022-01-15 NOTE — Assessment & Plan Note (Signed)
Controlled, no change in medication  

## 2022-01-15 NOTE — Assessment & Plan Note (Signed)
Hyperlipidemia:Low fat diet discussed and encouraged.   Lipid Panel  Lab Results  Component Value Date   CHOL 162 01/12/2022   HDL 49 01/12/2022   LDLCALC 94 01/12/2022   TRIG 107 01/12/2022   CHOLHDL 3.3 01/12/2022     Controlled, no change in medication

## 2022-01-15 NOTE — Assessment & Plan Note (Addendum)
Michaela [name] is reminded of the importance of commitment to daily physical activity for 30 minutes or more, as able and the need to limit carbohydrate intake to 30 to 60 grams per meal to help with blood sugar control.   The need to take medication as prescribed, test blood sugar as directed, and to call between visits if there is a concern that blood sugar is uncontrolled is also discussed.   Michaela [name] is reminded of the importance of daily foot exam, annual eye examination, and good blood sugar, blood pressure and cholesterol control. Uncontrolled increase insulin dose, refer p clinical pharmacist for med manaement  Diabetic Labs Latest Ref Rng & Units 01/12/2022 01/11/2022 12/14/2020 07/06/2020 07/06/2020  HbA1c 0.0 - 7.0 % - 7.8(A) 8.6(H) 8.4(A) 8.4(A)  Microalbumin 0.00 - 1.89 mg/dL - - - - -  Micro/Creat Ratio 0 - 29 mg/g creat 5 - - - -  Chol 100 - 199 mg/dL 162 - - - -  HDL >39 mg/dL 49 - - - -  Calc LDL 0 - 99 mg/dL 94 - - - -  Triglycerides 0 - 149 mg/dL 107 - - - -  Creatinine 0.57 - 1.00 mg/dL 1.05(H) - 0.91 - -   BP/Weight 01/11/2022 01/26/2021 12/22/2020 11/28/2020 07/20/2020 07/06/2020 18/98/4210  Systolic BP 312 811 - 886 773 736 681  Diastolic BP 70 70 - 70 72 71 74  Wt. (Lbs) 153 158 158 158 158.12 156 161  BMI 26.26 27.12 27.12 27.12 27.14 26.78 27.64   Foot/eye exam completion dates 07/06/2020 06/25/2019  Foot Form Completion Done Done

## 2022-01-15 NOTE — Telephone Encounter (Signed)
° °  Telephone encounter was:  Successful.  01/15/2022 Name: JODY AGUINAGA MRN: [id number] DOB: [birth year]  TINY CHAUDHARY is a 66 y.o. year old female who is a primary care patient of Moshe Cipro Norwood Levo, MD . The community resource team was consulted for assistance with  housing  Care guide performed the following interventions: Patient provided with information about care guide support team and interviewed to confirm resource needs. Patient stated she has been sleeping on her mothers couch for now but cant stay there long because she is in [place] housing. She cant get guardianship back of her son until she can find a place of her own. Patient needs rx resources as well          Follow Up Plan:  Care guide will follow up with patient by phone over the next 2 weeks    [facility], Care Management  [phone] [address], [place], [place] [address] Phone: [phone] Email: [email or web address]

## 2022-01-15 NOTE — Assessment & Plan Note (Signed)
Managed by psychiatry , receiving therapy regularly

## 2022-01-15 NOTE — Progress Notes (Signed)
Called pt no answer °

## 2022-01-16 ENCOUNTER — Telehealth: Payer: Self-pay

## 2022-01-16 NOTE — Progress Notes (Signed)
Patient aware.

## 2022-01-16 NOTE — Telephone Encounter (Signed)
[name] called pt and gave lab results

## 2022-01-16 NOTE — Telephone Encounter (Signed)
patient returning call maybe about lab results.  Patient not 100% who called. Call back # [phone]

## 2022-01-17 NOTE — Telephone Encounter (Signed)
Dr Gershon Crane-- Lovett Calender was the last Dr    Sending for records

## 2022-01-22 NOTE — Chronic Care Management (AMB) (Signed)
Chronic Care Management   Note  01/22/2022 Name: Michaela [name] MRN: [id number] DOB: 17-Apr-1956  Michaela [name] is a 66 y.o. year old female who is a primary care patient of Moshe Cipro Norwood Levo, MD. I reached out to Raymond Gurney by phone today in response to a referral sent by Michaela [name]'s PCP.  Michaela [name] was given information about Chronic Care Management services today including:  CCM service includes personalized support from designated clinical staff supervised by her physician, including individualized plan of care and coordination with other care providers 24/7 contact phone numbers for assistance for urgent and routine care needs. Service will only be billed when office clinical staff spend 20 minutes or more in a month to coordinate care. Only one practitioner may furnish and bill the service in a calendar month. The patient may stop CCM services at any time (effective at the end of the month) by phone call to the office staff. The patient is responsible for co-pay (up to 20% after annual deductible is met) if co-pay is required by the individual health plan.   Patient agreed to services and verbal consent obtained.   Follow up plan: Telephone appointment with care management team member scheduled for:02/14/22  [facility] Management  Direct Dial: [phone]

## 2022-01-25 ENCOUNTER — Other Ambulatory Visit: Payer: Self-pay

## 2022-01-25 ENCOUNTER — Telehealth: Payer: Self-pay | Admitting: Family Medicine

## 2022-01-25 MED ORDER — LOSARTAN POTASSIUM 50 MG PO TABS: 50.0000 mg | ORAL_TABLET | Freq: Every day | ORAL | 1 refills | Status: DC

## 2022-01-25 MED ORDER — ERGOCALCIFEROL 1.25 MG (50000 UT) PO CAPS: 50000.0000 [IU] | ORAL_CAPSULE | ORAL | 1 refills | Status: DC

## 2022-01-25 MED ORDER — INSULIN GLARGINE 100 UNITS/ML SOLOSTAR PEN
35.0000 [IU] | PEN_INJECTOR | Freq: Every day | SUBCUTANEOUS | 3 refills | Status: AC
Start: 2022-01-25 — End: ?

## 2022-01-25 NOTE — Telephone Encounter (Signed)
Cannot find the [facility] in the system, will print meds and fax them over to the [facility] at [phone] ?

## 2022-01-25 NOTE — Telephone Encounter (Signed)
Patient called in about most recent refills.  ? ?Meds were send to wrong pharm . ? ?ALL meds and refills need to go to  the [facility], [facility] , [facility] and not [facility]  ? ?Patient would like meds resent over to the [facility]  ?

## 2022-01-30 ENCOUNTER — Telehealth: Payer: Self-pay

## 2022-01-30 NOTE — Telephone Encounter (Signed)
? ?  Telephone encounter was:  Successful.  ?01/30/2022 ?Name: [name] MRN: [id number] DOB: Feb 01, 1956 ? ?[name] is a 66 y.o. year old female who is a primary care patient of Moshe Cipro Norwood Levo, MD . The community resource team was consulted for assistance with  housing ? ?Care guide performed the following interventions: Patient provided with information about care guide support team and interviewed to confirm resource needs. Patient stated she did get resouces in the mail and has some options for housing now ? ?Follow Up Plan:  No further follow up planned at this time. The patient has been provided with needed resources. ? ? ? ?Larena Sox ?Care Guide, Embedded Care Coordination ?[facility], Care Management  ?[phone] ?[address], [place], [place] [address] ?Phone: [phone] ?Email: [email or web address] ? ?  ?

## 2022-02-14 ENCOUNTER — Ambulatory Visit: Payer: No Typology Code available for payment source | Admitting: Pharmacist

## 2022-02-14 DIAGNOSIS — E1165 Type 2 diabetes mellitus with hyperglycemia: Secondary | ICD-10-CM

## 2022-02-14 LAB — HM DIABETES EYE EXAM

## 2022-02-14 NOTE — Chronic Care Management (AMB) (Signed)
?Care Management  ? ?Pharmacy Note ? ?02/14/2022 ?Name: Michaela [name] MRN: [id number] DOB: 28-Nov-1955 ? ?Summary: ?General ?Completed medication reconciliation to the best of my ability over the phone but patient is a poor historian regarding her medications ?Regimen appears to be complicated by multiple providers [facility] similar medications for her with different dosing and patient gets confused (Glipizide XL 10 mg daily and glipizide IR 10 mg three time daily before meals) ?Completed general disease state education as well as answered patient's questions regarding her medications and best time to take ? ?Type 2 Diabetes ?Uncontrolled; Most recent A1c above goal of <7% per ADA guidelines ?Current medications: metformin 1,000 mg by mouth twice daily, glipizide IR 10 mg by mouth three times daily, and insulin glargine (Lantus) 35 units subcutaneously once daily ?Recent changes: primary care provider increased Lantus from 30>35 units subcutaneously once daily ?Denies recent hypoglycemia ?Continue current medications as above per primary care provider ?May be reasonable to consider GLP-1 agonist (Ozempic) or SGLT-2 inhibitor (Jardiance). This may allow reduction in sulfonylurea and insulin which would lower hypoglycemia risk.  ? ?Subjective: ?Michaela [name] is a 66 y.o. year old female who is a primary care patient of Fayrene Helper, MD. The Care Management team was consulted for assistance with care management and care coordination needs.   ? ?Engaged with patient by telephone for initial visit in response to provider referral for pharmacy case management and/or care coordination services.  ? ?The patient was given information about Care Management services today including:  ?Care Management services includes personalized support from designated clinical staff supervised by the patient's primary care provider, including individualized plan of care and coordination with other care  providers. ?24/7 contact phone numbers for assistance for urgent and routine care needs. ?The patient may stop case management services at any time by phone call to the office staff. ? ?Patient agreed to services and consent obtained. ? ?Assessment:  Review of patient status, including review of consultants reports, laboratory and other test data, was performed as part of comprehensive evaluation and provision of chronic care management services.  ? ?SDOH (Social Determinants of Health) assessments and interventions performed:   ? ?Objective: ? ?Lab Results  ?Component Value Date  ? CREATININE 1.05 (H) 01/12/2022  ? CREATININE 0.91 12/14/2020  ? CREATININE 1.00 02/03/2014  ? ? ?Lab Results  ?Component Value Date  ? HGBA1C 7.8 (A) 01/11/2022  ? ? ?   ?Component Value Date/Time  ? CHOL 162 01/12/2022 1018  ? TRIG 107 01/12/2022 1018  ? HDL 49 01/12/2022 1018  ? CHOLHDL 3.3 01/12/2022 1018  ? CHOLHDL 6.0 02/03/2014 1208  ? VLDL 41 (H) 02/03/2014 1208  ? Leith 94 01/12/2022 1018  ? ? ?Clinical ASCVD: No  ?The 10-year ASCVD risk score (Arnett DK, et al., 2019) is: 28.6% ?  Values used to calculate the score: ?    Age: 56 years ?    Sex: Female ?    Is Non-Hispanic African American: Yes ?    Diabetic: Yes ?    Tobacco smoker: Yes ?    Systolic Blood Pressure: 409 mmHg ?    Is BP treated: Yes ?    HDL Cholesterol: 49 mg/dL ?    Total Cholesterol: 162 mg/dL   ? ?BP Readings from Last 3 Encounters:  ?01/11/22 118/70  ?01/27/21 125/70  ?11/28/20 123/70  ? ? ?Care Plan ? ?Allergies  ?Allergen Reactions  ? Calcium Acetate Shortness Of Breath  ?  Ibuprofen   ? Lisinopril Cough  ? Sulfonamide Derivatives   ? Nickel Rash  ? Sulfa Antibiotics Rash  ? ? ?Medications Reviewed Today   ? ? Reviewed by Beryle Lathe, [facility] (Pharmacist) on 02/14/22 at 1527  Med List Status: <None>  ? ?Medication Order Taking? Sig Documenting Provider Last Dose Status Informant  ?0.9 %  sodium chloride infusion [id number]   Mauri Pole, MD   Active   ?albuterol (VENTOLIN HFA) 108 (90 Base) MCG/ACT inhaler [id number] Yes INHALE 1-2 PUFFS BY MOUTH FOUR TIMES A DAY AS NEEDED FOR BRONCHOSPASM PREVENTION [provider] Taking Active   ?Alcohol Swabs (CURITY ALCOHOL PREPS) 70 % PADS [id number] No  [provider] Unknown Active   ?Artificial Tear Solution (TEARS NATURALE OP) [id number] Yes Place 1 drop into both eyes daily as needed. [provider] Taking Active Self  ?aspirin 81 MG EC tablet [id number] Yes Take 1 tablet by mouth daily. [provider] Taking Active   ?atorvastatin (LIPITOR) 40 MG tablet [id number] Yes TAKE ONE TABLET BY MOUTH EVERY NIGHT AT BEDTIME FOR CHOLESTEROL [provider] Taking Active   ?azelastine (ASTELIN) 0.1 % nasal spray [id number] Yes SPRAY 2 SPRAYS IN EACH NOSTRIL TWICE A DAY FOR SEASONAL RUNNY NOSE [provider] Taking Active   ?         ?Med Note Beryle Lathe   Wed Feb 14, 2022  3:09 PM) Uses seasonally  ?calcitRIOL (ROCALTROL) 0.25 MCG capsule [id number] No Take 0.25 mcg by mouth daily.  ?Patient not taking: Reported on 02/14/2022  ? [provider] Not Taking Active   ?Calcium Carb-Cholecalciferol (CALCIUM 500 +D) 500-400 MG-UNIT TABS [id number] No Twice daily  ?Patient not taking: Reported on 02/14/2022  ? Fayrene Helper, MD Not Taking Active   ?cetirizine (ZYRTEC) 10 MG tablet [id number] Yes Take 10 mg by mouth daily as needed. [provider] Taking Active   ?         ?Med Note Vernell Barrier Feb 14, 2022  3:09 PM)    ?dextromethorphan-guaiFENesin (ROBITUSSIN-DM) 10-100 MG/5ML liquid [id number] Yes TAKE 10ML BY MOUTH EVERY SIX HOURS AS NEEDED FOR COUGH [provider] Taking Active   ?ergocalciferol (VITAMIN D2) 1.25 MG (50000 UT) capsule [id number] No Take 1 capsule (50,000 Units total) by mouth once a week. One capsule once weekly  ?Patient not taking: Reported on 02/14/2022  ? Fayrene Helper, MD Not Taking  Active   ?         ?Med Note Waldo Laine, Gwenyth Allegra   Wed Feb 14, 2022  3:13 PM) Has not yet received  ?glipiZIDE (GLUCOTROL XL) 10 MG 24 hr tablet [id number] Yes Take 1 tablet (10 mg total) by mouth daily with breakfast. Fayrene Helper, MD Taking Active   ?         ?Med Note Waldo Laine, Gwenyth Allegra   Wed Feb 14, 2022  3:21 PM) Patient reports taking glipizide IR 10 mg three time daily before meals  ?insulin glargine (LANTUS) 100 unit/mL SOPN [id number] Yes Inject 35 Units into the skin daily. Fayrene Helper, MD Taking Active   ?losartan (COZAAR) 50 MG tablet [id number] Yes Take 1 tablet (50 mg total) by mouth daily. Fayrene Helper, MD Taking Active   ?metFORMIN (GLUCOPHAGE) 1000 MG tablet [id number] Yes Take 1 tablet by mouth 2 (two) times daily. [provider] Taking Active   ?metoprolol (TOPROL-XL) 100 MG 24 hr tablet [id number] Yes Take 50  mg by mouth 2 (two) times daily. [provider] Taking Active   ?mycophenolate (MYFORTIC) 180 MG EC tablet [id number] Yes Take 360 mg by mouth 2 (two) times daily. [provider] Taking Active   ?pantoprazole (PROTONIX) 40 MG tablet [id number] Yes Take 40 mg by mouth daily. [provider] Taking Active   ?predniSONE (DELTASONE) 5 MG tablet [id number] Yes Take 5 mg by mouth daily. [provider] Taking Active   ?tacrolimus (PROGRAF) 1 MG capsule [id number] Yes Take 4 mg by mouth 2 (two) times daily. [provider] Taking Active Self  ?         ?Med Note Vernell Barrier Feb 14, 2022  3:23 PM)    ?TechLite Lancets MISC [id number] Yes  [provider] Taking Active   ? ?  ?  ? ?  ? ? ?Patient Active Problem List  ? Diagnosis Date Noted  ? Housing lack 01/11/2022  ? Osteoporosis 01/01/2021  ? Age-related osteoporosis without current pathological fracture 01/01/2021  ? Chronic left flank pain 07/20/2020  ? Chest pain on exertion 06/25/2019  ? Depression, major, single episode, severe ([place])  06/25/2019  ? Overweight (BMI 25.0-29.9) 05/10/2018  ? Uncontrolled type 2 diabetes mellitus with nephropathy ([place]) 07/19/2014  ? Insomnia 03/22/2014  ? Metabolic syndrome X 62/94/7654  ? History of renal transplant 06/21/2

## 2022-02-14 NOTE — Patient Instructions (Signed)
Michaela [name], ? ?It was great to talk to you today! ? ?Please call me with any questions or concerns. ? ?Visit Information ? ?Care Plan : Medication Management  ?Updates made by Michaela [name], Michaela [name] since 02/14/2022 12:00 AM  ?Completed 02/14/2022  ? ?Problem: T2DM Resolved 02/14/2022  ?Priority: High  ?Onset Date: 02/14/2022  ?  ? ?Goal: Disease Progression Prevention Completed 02/14/2022  ?Start Date: 02/14/2022  ?Expected End Date: 03/16/2022  ?This Visit's Progress: On track  ?Priority: High  ?Note:   ?Current Barriers:  ?Unable to achieve control of diabetes ? ?Pharmacist Clinical Goal(s):  ?Through collaboration with PharmD and provider, patient will  ?Achieve control of diabetes as evidenced by improved fasting blood sugar and improved A1c  ? ?Interventions: ?1:1 collaboration with Michaela Helper, MD regarding development and update of comprehensive plan of care as evidenced by provider attestation and co-signature ?Inter-disciplinary care team collaboration (see longitudinal plan of care) ?Comprehensive medication review performed; medication list updated in electronic medical record ? ?Type 2 Diabetes - New goal.: ?Uncontrolled; Most recent A1c above goal of <7% per ADA guidelines ?Current medications: metformin 1,000 mg by mouth twice daily, glipizide IR 10 mg by mouth three times daily, and insulin glargine (Lantus) 35 units subcutaneously once daily ?Recent changes: primary care provider increased Lantus from 30>35 units subcutaneously once daily ?Regimen appears to be complicated by multiple providers [facility] for her with different dosing and patient gets confused (Glipizide XL 10 mg daily and glipizide IR 10 mg three time daily before meals) ?Denies recent hypoglycemia ?On a statin: yes ?On aspirin 81 mg daily: yes ?Last microalbumin/creatinine ratio: 5 (01/12/22); on an ACEi/ARB: yes ?Continue current medications as above per primary care provider ?May  be reasonable to consider GLP-1 agonist (Ozempic) or SGLT-2 inhibitor (Jardiance). This may allow reduction in sulfonylurea and insulin which would lower hypoglycemia risk.  ? ?Patient Goals/Self-Care Activities ?Patient will:  ?Focus on medication adherence by keeping up with prescription refills and either using a pill box or reminders to take your medications at the prescribed times ?Check blood sugar as instructed by primary care provider, document, and provide at future appointments ?Engage in dietary modifications by fewer sweetened foods & beverages ? ?Follow Up Plan: Next PCP appointment scheduled for: 04/11/22 ?  ? ? ?Michaela [name] was given information about care management services today including:  ?CCM service includes personalized support from designated clinical staff supervised by her physician, including individualized plan of care and coordination with other care providers ?24/7 contact phone numbers for assistance for urgent and routine care needs. ?The patient may stop CCM services at any time (effective at the end of the month) by phone call to the office staff. ? ?Patient agreed to services and verbal consent obtained.  ? ?Follow-up plan: Next PCP appointment scheduled for: 04/11/22 ? ?The patient verbalized understanding of instructions, educational materials, and care plan provided today and declined offer to receive copy of patient instructions, educational materials, and care plan.  ? ?Please call the care guide team at [phone] if you need to cancel or reschedule your appointment.  ? ?Michaela [name], PharmD, BCACP, CPP ?Clinical Pharmacist Practitioner ?[place] ?[phone]  ?

## 2022-03-15 ENCOUNTER — Ambulatory Visit: Payer: No Typology Code available for payment source

## 2022-03-21 ENCOUNTER — Other Ambulatory Visit: Payer: Self-pay

## 2022-03-21 ENCOUNTER — Telehealth: Payer: Self-pay | Admitting: Family Medicine

## 2022-03-21 MED ORDER — LOSARTAN POTASSIUM 50 MG PO TABS: 50.0000 mg | ORAL_TABLET | Freq: Every day | ORAL | 1 refills | Status: DC

## 2022-03-21 MED ORDER — ERGOCALCIFEROL 1.25 MG (50000 UT) PO CAPS: 50000.0000 [IU] | ORAL_CAPSULE | ORAL | 1 refills | Status: DC

## 2022-03-21 NOTE — Telephone Encounter (Signed)
Patient called in regard to med refills  ? ?Patient needs refills on  ?losartan (COZAAR) 50 MG tablet ? ?ergocalciferol (VITAMIN D2) 1.25 MG (50000 UT) capsule ? ?Patient needs meds to go to [facility] , patient does not deal with [facility] ? ? ?Patient fyi no longer using the [employer] , patient has only [employer] for now. ? ? ?

## 2022-03-21 NOTE — Telephone Encounter (Signed)
Refill sent.

## 2022-04-11 ENCOUNTER — Telehealth: Payer: Self-pay | Admitting: Family Medicine

## 2022-04-11 ENCOUNTER — Ambulatory Visit: Payer: No Typology Code available for payment source | Admitting: Family Medicine

## 2022-04-11 ENCOUNTER — Other Ambulatory Visit: Payer: Self-pay

## 2022-04-11 MED ORDER — LOSARTAN POTASSIUM 50 MG PO TABS: 50.0000 mg | ORAL_TABLET | Freq: Every day | ORAL | 1 refills | Status: DC

## 2022-04-11 MED ORDER — ERGOCALCIFEROL 1.25 MG (50000 UT) PO CAPS: 50000.0000 [IU] | ORAL_CAPSULE | ORAL | 1 refills | Status: DC

## 2022-04-11 MED ORDER — ERGOCALCIFEROL 1.25 MG (50000 UT) PO CAPS: 50000.0000 [IU] | ORAL_CAPSULE | ORAL | 1 refills | Status: AC

## 2022-04-11 NOTE — Telephone Encounter (Signed)
Pt called back in regards to [facility] that are [facility]. Can you please call pt and let her explain all the is going on with the meds? States she has been calling and having messages sent, but last message sent was April? Specifically asking for [name].  ?

## 2022-04-12 NOTE — Telephone Encounter (Signed)
[facility] to [facility]

## 2022-05-14 ENCOUNTER — Other Ambulatory Visit: Payer: Self-pay | Admitting: Family Medicine

## 2022-05-17 ENCOUNTER — Ambulatory Visit: Payer: No Typology Code available for payment source | Admitting: Family Medicine

## 2022-05-18 ENCOUNTER — Ambulatory Visit: Payer: No Typology Code available for payment source | Admitting: Family Medicine

## 2022-10-22 ENCOUNTER — Encounter: Payer: No Typology Code available for payment source | Admitting: Podiatry

## 2022-10-25 ENCOUNTER — Ambulatory Visit (INDEPENDENT_AMBULATORY_CARE_PROVIDER_SITE_OTHER): Payer: No Typology Code available for payment source | Admitting: Podiatry

## 2022-10-25 DIAGNOSIS — L600 Ingrowing nail: Secondary | ICD-10-CM

## 2022-10-25 NOTE — Patient Instructions (Signed)

## 2022-10-25 NOTE — Progress Notes (Signed)
Subjective: Chief Complaint  Patient presents with   Ingrown Toenail    Bilateral ingrown toenails, hallux,started a year ago,  patient states she has drainage coming from the toenail at times, sore    66 year old female presents the office today with above concerns.  Left big toe is worse than right. It either bleeds or clear of the nail becomes ingrown.  No pus that she has noticed.  No redness or red streaks.  Last A1c was 7.8 on 01/11/2022  Objective: AAO x3, NAD DP/PT pulses palpable bilaterally, CRT less than 3 seconds Incurvation present to the hallux toenails bilaterally.  Not able to appreciate any drainage or pus today.  There is no skin edema there is no erythema or any signs of infection.  No signs of an abscess today. No pain with calf compression, swelling, warmth, erythema  Assessment: Ingrown toenails bilateral hallux  Plan: -All treatment options discussed with the patient including all alternatives, risks, complications.  -We discussed partial nail avulsions however ultimately she went to hold off on this today.  I did sharply debride the nails without any complications but a small amount of bleeding did occur as there was some minimal granulation tissue already starting to form on the nail border.  Is able to remove the corner of the nail.  Recommend Epsom salt soaks, antibiotic ointment dressing changes.  Should symptoms continue or worsen will need to proceed with partial nail avulsion and she understands. -Patient encouraged to call the office with any questions, concerns, change in symptoms.   Trula Slade DPM

## 2022-11-12 ENCOUNTER — Other Ambulatory Visit: Payer: Self-pay | Admitting: Family Medicine

## 2022-11-13 MED ORDER — LOSARTAN POTASSIUM 50 MG PO TABS: 50.0000 mg | ORAL_TABLET | Freq: Every day | ORAL | 2 refills | Status: AC

## 2022-11-21 NOTE — Progress Notes (Signed)
Patient cancelled

## 2023-03-08 ENCOUNTER — Other Ambulatory Visit: Payer: Self-pay

## 2023-03-08 ENCOUNTER — Encounter: Payer: Self-pay | Admitting: Emergency Medicine

## 2023-03-08 ENCOUNTER — Ambulatory Visit
Admission: EM | Admit: 2023-03-08 | Discharge: 2023-03-08 | Disposition: A | Discharge status: Home / Self Care | Payer: Self-pay | Attending: Nurse Practitioner | Admitting: Nurse Practitioner

## 2023-03-08 DIAGNOSIS — Z940 Kidney transplant status: Secondary | ICD-10-CM | POA: Diagnosis present

## 2023-03-08 DIAGNOSIS — X58XXXA Exposure to other specified factors, initial encounter: Secondary | ICD-10-CM | POA: Diagnosis not present

## 2023-03-08 DIAGNOSIS — R319 Hematuria, unspecified: Secondary | ICD-10-CM

## 2023-03-08 LAB — POCT URINALYSIS DIP (MANUAL ENTRY)
Bilirubin, UA: NEGATIVE
Glucose, UA: NEGATIVE mg/dL
Ketones, POC UA: NEGATIVE mg/dL
Nitrite, UA: NEGATIVE
Protein Ur, POC: NEGATIVE mg/dL
Spec Grav, UA: >=1.03 — AB (ref 1.010–1.025)
Urobilinogen, UA: 0.2 E.U./dL
pH, UA: 5 (ref 5.0–8.0)
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
## 2023-03-08 NOTE — Discharge Instructions (Signed)
The urinalysis shows a trace of blood in your urine.  It also shows that you need to increase your water intake.  Recommend trying to drink at least 6-8 8 ounce glasses of water daily.  A urine culture is pending.  You will be contacted if the results of the urine culture shows that you need to be treated. May take over-the-counter Tylenol as needed for pain, fever, or general discomfort. As discussed, for muscle soreness, recommend Epsom salt soaks as needed. [facility].  I would like for you to let your physician know at the [place] that you have seen blood in your urine. Follow-up as needed.

## 2023-03-10 LAB — URINE CULTURE: Culture: <10000 — AB

## 2023-03-26 ENCOUNTER — Ambulatory Visit: Payer: No Typology Code available for payment source | Admitting: Family Medicine

## 2023-04-10 ENCOUNTER — Emergency Department (HOSPITAL_COMMUNITY)
Admission: EM | Admit: 2023-04-10 | Discharge: 2023-04-10 | Disposition: A | Discharge status: Home / Self Care | Payer: No Typology Code available for payment source | Attending: Emergency Medicine | Admitting: Emergency Medicine

## 2023-04-10 ENCOUNTER — Other Ambulatory Visit: Payer: Self-pay

## 2023-04-10 ENCOUNTER — Encounter (HOSPITAL_COMMUNITY): Payer: Self-pay | Admitting: *Deleted

## 2023-04-10 DIAGNOSIS — E1122 Type 2 diabetes mellitus with diabetic chronic kidney disease: Secondary | ICD-10-CM | POA: Diagnosis not present

## 2023-04-10 DIAGNOSIS — Z794 Long term (current) use of insulin: Secondary | ICD-10-CM | POA: Diagnosis not present

## 2023-04-10 DIAGNOSIS — R319 Hematuria, unspecified: Secondary | ICD-10-CM

## 2023-04-10 DIAGNOSIS — N3090 Cystitis, unspecified without hematuria: Secondary | ICD-10-CM | POA: Insufficient documentation

## 2023-04-10 DIAGNOSIS — Z8551 Personal history of malignant neoplasm of bladder: Secondary | ICD-10-CM | POA: Insufficient documentation

## 2023-04-10 DIAGNOSIS — N186 End stage renal disease: Secondary | ICD-10-CM | POA: Diagnosis not present

## 2023-04-10 DIAGNOSIS — Z7982 Long term (current) use of aspirin: Secondary | ICD-10-CM | POA: Insufficient documentation

## 2023-04-10 DIAGNOSIS — Z7984 Long term (current) use of oral hypoglycemic drugs: Secondary | ICD-10-CM | POA: Insufficient documentation

## 2023-04-10 DIAGNOSIS — Z940 Kidney transplant status: Secondary | ICD-10-CM | POA: Diagnosis not present

## 2023-04-10 LAB — BASIC METABOLIC PANEL
Anion gap: 9 (ref 5–15)
BUN: 14 mg/dL (ref 8–23)
CO2: 26 mmol/L (ref 22–32)
Calcium: 9.7 mg/dL (ref 8.9–10.3)
Chloride: 102 mmol/L (ref 98–111)
Creatinine, Ser: 0.84 mg/dL (ref 0.44–1.00)
GFR, Estimated: >60 mL/min (ref >60)
Glucose, Bld: 119 mg/dL — ABNORMAL HIGH (ref 70–99)
Potassium: 4 mmol/L (ref 3.5–5.1)
Sodium: 137 mmol/L (ref 135–145)

## 2023-04-10 LAB — URINALYSIS, ROUTINE W REFLEX MICROSCOPIC
Bilirubin Urine: NEGATIVE
Glucose, UA: NEGATIVE mg/dL
Ketones, ur: NEGATIVE mg/dL
Nitrite: NEGATIVE
Protein, ur: NEGATIVE mg/dL
Specific Gravity, Urine: 1.018 (ref 1.005–1.030)
pH: 5 (ref 5.0–8.0)

## 2023-04-10 LAB — CBC
HCT: 38 % (ref 36.0–46.0)
Hemoglobin: 11.8 g/dL — ABNORMAL LOW (ref 12.0–15.0)
MCH: 26.3 pg (ref 26.0–34.0)
MCHC: 31.1 g/dL (ref 30.0–36.0)
MCV: 84.6 fL (ref 80.0–100.0)
Platelets: 145 10*3/uL — ABNORMAL LOW (ref 150–400)
RBC: 4.49 MIL/uL (ref 3.87–5.11)
RDW: 14.5 % (ref 11.5–15.5)
WBC: 6.7 10*3/uL (ref 4.0–10.5)
nRBC: 0 % (ref 0.0–0.2)

## 2023-04-10 MED ORDER — CEPHALEXIN 500 MG PO CAPS: 500.0000 mg | ORAL_CAPSULE | Freq: Two times a day (BID) | ORAL | 0 refills | Status: AC

## 2023-04-10 MED ORDER — SODIUM CHLORIDE 0.9 % IV SOLN
1.0000 g | Freq: Once | INTRAVENOUS | Status: AC
  Administered 2023-04-10: 1 g via INTRAVENOUS
  Filled 2023-04-10: qty 10

## 2023-04-10 NOTE — ED Provider Notes (Signed)
[place] EMERGENCY DEPARTMENT AT [facility] Provider Note   CSN: [id number] Arrival date & time: 04/10/23  1700     History {Add pertinent medical, surgical, social history, OB history to HPI:1} Chief Complaint  Patient presents with   Hematuria    Michaela [name] is a 67 y.o. female.  Renal transplant prograf, DM  Hematuria 1 month off and on [facility] referred in [place] [facility] Also with R flank pain off and on       Home Medications Prior to Admission medications   Medication Sig Start Date End Date Taking? Authorizing Provider  aspirin 81 MG EC tablet Take 1 tablet by mouth daily. 10/06/12  Yes [provider]  atorvastatin (LIPITOR) 40 MG tablet Take 40 mg by mouth daily. 10/09/21  Yes [provider]  azelastine (ASTELIN) 0.1 % nasal spray Place 2 sprays into both nostrils 2 (two) times daily. 12/22/21  Yes [provider]  calcitRIOL (ROCALTROL) 0.25 MCG capsule Take 0.25 mcg by mouth daily. 12/20/19  Yes [provider]  cetirizine (ZYRTEC) 10 MG tablet Take 10 mg by mouth daily as needed.   Yes [provider]  ergocalciferol (VITAMIN D2) 1.25 MG (50000 UT) capsule Take 1 capsule (50,000 Units total) by mouth once a week. One capsule once weekly 04/11/22  Yes Kerri Perches, MD  insulin glargine (LANTUS) 100 unit/mL SOPN Inject 35 Units into the skin daily. 01/25/22  Yes Kerri Perches, MD  losartan (COZAAR) 50 MG tablet Take 1 tablet (50 mg total) by mouth daily. 11/13/22  Yes Kerri Perches, MD  metFORMIN (GLUCOPHAGE) 1000 MG tablet Take 1 tablet by mouth 2 (two) times daily. 02/14/21  Yes [provider]  metoprolol (TOPROL-XL) 100 MG 24 hr tablet Take 50 mg by mouth 2 (two) times daily.   Yes [provider]  mycophenolate (MYFORTIC) 180 MG EC tablet Take 360 mg by mouth 2 (two) times daily.   Yes [provider]  pantoprazole (PROTONIX) 40 MG tablet Take 40 mg by mouth daily.  11/17/19  Yes [provider]  predniSONE (DELTASONE) 5 MG tablet Take 5 mg by mouth daily.   Yes [provider]  tacrolimus (PROGRAF) 1 MG capsule Take 4 mg by mouth 2 (two) times daily.   Yes [provider]  albuterol (VENTOLIN HFA) 108 (90 Base) MCG/ACT inhaler INHALE 1-2 PUFFS BY MOUTH FOUR TIMES A DAY AS NEEDED FOR BRONCHOSPASM PREVENTION 12/22/21   [provider]  Alcohol Swabs (CURITY ALCOHOL PREPS) 70 % PADS  02/08/20   [provider]  Artificial Tear Solution (TEARS NATURALE OP) Place 1 drop into both eyes daily as needed.    [provider]  Calcium Carb-Cholecalciferol (CALCIUM 500 +D) 500-400 MG-UNIT TABS Twice daily Patient not taking: Reported on 02/14/2022 12/27/20   Kerri Perches, MD  glipiZIDE (GLUCOTROL XL) 10 MG 24 hr tablet Take 1 tablet (10 mg total) by mouth daily with breakfast. 01/26/21   Kerri Perches, MD  TechLite Lancets MISC  12/28/19   [provider]      Allergies    Calcium acetate, Ibuprofen, Lisinopril, Sulfonamide derivatives, Nickel, and Sulfa antibiotics    Review of Systems   Review of Systems  Physical Exam Updated Vital Signs BP (!) 149/72 (BP Location: Right Arm)   Pulse 73   Temp 98.5 F (36.9 C) (Oral)   Resp (!) 24   Ht 5\' 4"  (1.626 m)   Wt 65.8 kg  SpO2 100%   BMI 24.89 kg/m  Physical Exam  ED Results / Procedures / Treatments   Labs (all labs ordered are listed, but only abnormal results are displayed) Labs Reviewed  URINALYSIS, ROUTINE W REFLEX MICROSCOPIC - Abnormal; Notable for the following components:      Result Value   APPearance HAZY (*)    Hgb urine dipstick LARGE (*)    Leukocytes,Ua LARGE (*)    Bacteria, UA RARE (*)    Non Squamous Epithelial 0-5 (*)    All other components within normal limits    EKG None  Radiology No results found.  Procedures Procedures  {Document cardiac monitor, telemetry assessment procedure when  appropriate:1}  Medications Ordered in ED Medications - No data to display  ED Course/ Medical Decision Making/ A&P   {   Click here for ABCD2, HEART and other calculatorsREFRESH Note before signing :1}                          Medical Decision Making Amount and/or Complexity of Data Reviewed Labs: ordered.   ***  {Document critical care time when appropriate:1} {Document review of labs and clinical decision tools ie heart score, Chads2Vasc2 etc:1}  {Document your independent review of radiology images, and any outside records:1} {Document your discussion with family members, caretakers, and with consultants:1} {Document social determinants of health affecting pt's care:1} {Document your decision making why or why not admission, treatments were needed:1} Final Clinical Impression(s) / ED Diagnoses Final diagnoses:  None    Rx / DC Orders ED Discharge Orders     None
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
## 2023-04-10 NOTE — Discharge Instructions (Signed)
You were seen for your bloody urine in the emergency department.   At home, please take the antibiotic we have prescribed you in case you have a urinary tract infection.    Check your MyChart online for the results of any tests that had not resulted by the time you left the emergency department.   Follow-up with your primary doctor in 2-3 days regarding your visit.  Follow-up with your transplant specialist as soon as possible to discuss if you need any additional imaging.  Follow-up with the urologist to see if you need cystoscopy.  Return immediately to the emergency department if you experience any of the following: Urinary retention, fevers, or any other concerning symptoms.    Thank you for visiting our Emergency Department. It was a pleasure taking care of you today.

## 2023-04-12 LAB — URINE CULTURE

## 2023-04-13 LAB — TACROLIMUS LEVEL: Tacrolimus (FK506) - LabCorp: 9.5 ng/mL (ref 2.0–20.0)

## 2023-07-15 ENCOUNTER — Ambulatory Visit (INDEPENDENT_AMBULATORY_CARE_PROVIDER_SITE_OTHER): Payer: Self-pay | Admitting: Podiatry

## 2023-07-15 DIAGNOSIS — L600 Ingrowing nail: Secondary | ICD-10-CM

## 2023-07-15 NOTE — Progress Notes (Signed)
Subjective: Chief Complaint  Patient presents with   Nail Problem    Pt stated that she has bilateral hallux discomfort     67 year old female presents the office today with above concerns.  She is in the look at the big toes are still causing pain and she wants to proceed with an ingrown toenail procedure performed.  She states her blood sugar has been controlled.  Last A1c was 7.8 on 01/11/2022  Objective: AAO x3, NAD DP/PT pulses palpable bilaterally, CRT less than 3 seconds Incurvation present to the hallux toenails bilaterally.  She has been tenderness palpation along nail borders.  There is no edema, erythema, drainage or pus, able to appreciate any drainage today. No pain with calf compression, swelling, warmth, erythema  Assessment: Ingrown toenails bilateral hallux  Plan: -All treatment options discussed with the patient including all alternatives, risks, complications.  -At this time, the patient is requesting partial nail removal with chemical matricectomy to the symptomatic portion of the nail. Risks and complications were discussed with the patient for which they understand and written consent was obtained. Under sterile conditions a total of 3 mL of a mixture of 2% lidocaine plain and 0.5% Marcaine plain was infiltrated in a hallux block fashion. Once anesthetized, the skin was prepped in sterile fashion. A tourniquet was then applied. Next the medial, lateral aspect of hallux nail border was then sharply excised making sure to remove the entire offending nail border. Once the nails were ensured to be removed area was debrided and the underlying skin was intact. There is no purulence identified in the procedure. Next phenol was then applied under standard conditions and copiously irrigated.  Antibiotic ointment was applied. A dry sterile dressing was applied. After application of the dressing the tourniquet was removed and there is found to be an immediate capillary refill time  to the digit. The patient tolerated the procedure well any complications. Post procedure instructions were discussed the patient for which he verbally understood. Discussed signs/symptoms of infection and directed to call the office immediately should any occur or go directly to the emergency room. In the meantime, encouraged to call the office with any questions, concerns, changes symptoms.  Return in about 2 weeks (around 07/29/2023) for nail check.  Vivi Barrack DPM

## 2023-07-15 NOTE — Patient Instructions (Signed)

## 2023-07-19 ENCOUNTER — Telehealth: Payer: Self-pay | Admitting: Podiatry

## 2023-07-19 NOTE — Telephone Encounter (Signed)
Pt calling had ingrown done on Monday and she is wanting to know how long she should continue to soak the toe/foot and how long does she need to keep bandage on? Please advise

## 2023-08-01 ENCOUNTER — Ambulatory Visit (INDEPENDENT_AMBULATORY_CARE_PROVIDER_SITE_OTHER): Payer: No Typology Code available for payment source | Admitting: Podiatry

## 2023-08-01 DIAGNOSIS — L600 Ingrowing nail: Secondary | ICD-10-CM

## 2023-08-01 NOTE — Patient Instructions (Signed)
Continue soaking in epsom salts twice a day followed by antibiotic ointment and a band-aid. Can leave uncovered at night. Continue this until completely healed.  If the area has not healed in 2 weeks, call the office for follow-up appointment, or sooner if any problems arise.  Monitor for any signs/symptoms of infection. Call the office immediately if any occur or go directly to the emergency room. Call with any questions/concerns.  

## 2023-08-03 NOTE — Progress Notes (Signed)
Subjective:  Chief Complaint  Patient presents with   Follow-up    Nail check after ingrown toenails removal bilateral hallux bilateral borders. She continues to soak her feet in epsom salt and applies neosporin.     67 year old female presents the office today for proper evaluation after undergoing partial nail avulsions.  States that she is doing well.  No pain.  No drainage or pus.   Objective: AAO x3, NAD DP/PT pulses palpable bilaterally, CRT less than 3 seconds Patient is partial nail avulsions.  Mild scabbing is present but there is no drainage or pus.  There is no edema, erythema.  There is no fluctuation or crepitation.  There is no signs of infection.  No pain. No pain with calf compression, swelling, warmth, erythema  Assessment: Status post partial nail avulsions, healing well  Plan: -All treatment options discussed with the patient including all alternatives, risks, complications.  -Recommend continue soaking Epsom salts daily and wash with soap and water.  Antibiotic ointment dressing changes during the day but can leave the area open at nighttime.  Monitor for any signs or symptoms of infection or reoccurrence. -Patient encouraged to call the office with any questions, concerns, change in symptoms.   Vivi Barrack DPM

## 2023-08-27 ENCOUNTER — Other Ambulatory Visit (HOSPITAL_COMMUNITY): Payer: Self-pay | Admitting: Nurse Practitioner

## 2023-08-27 DIAGNOSIS — Z13820 Encounter for screening for osteoporosis: Secondary | ICD-10-CM

## 2023-09-05 ENCOUNTER — Ambulatory Visit (HOSPITAL_COMMUNITY)
Admission: RE | Admit: 2023-09-05 | Discharge: 2023-09-05 | Disposition: A | Discharge status: Home / Self Care | Payer: No Typology Code available for payment source | Source: Ambulatory Visit | Attending: Nurse Practitioner | Admitting: Nurse Practitioner

## 2023-09-05 DIAGNOSIS — Z13820 Encounter for screening for osteoporosis: Secondary | ICD-10-CM | POA: Insufficient documentation

## 2023-09-17 ENCOUNTER — Telehealth: Payer: Self-pay | Admitting: Family Medicine

## 2023-09-17 NOTE — Telephone Encounter (Signed)
Record given to patient.

## 2023-09-17 NOTE — Telephone Encounter (Signed)
Patient came by needs copy of shot record for her [place].

## 2023-10-10 ENCOUNTER — Encounter (HOSPITAL_COMMUNITY): Payer: Self-pay | Admitting: *Deleted

## 2023-10-10 ENCOUNTER — Emergency Department (HOSPITAL_COMMUNITY): Payer: No Typology Code available for payment source

## 2023-10-10 ENCOUNTER — Emergency Department (HOSPITAL_COMMUNITY)
Admission: EM | Admit: 2023-10-10 | Discharge: 2023-10-10 | Disposition: A | Discharge status: Home / Self Care | Payer: No Typology Code available for payment source | Attending: Emergency Medicine | Admitting: Emergency Medicine

## 2023-10-10 ENCOUNTER — Other Ambulatory Visit: Payer: Self-pay

## 2023-10-10 DIAGNOSIS — R0789 Other chest pain: Secondary | ICD-10-CM | POA: Insufficient documentation

## 2023-10-10 DIAGNOSIS — S161XXA Strain of muscle, fascia and tendon at neck level, initial encounter: Secondary | ICD-10-CM

## 2023-10-10 DIAGNOSIS — M542 Cervicalgia: Secondary | ICD-10-CM | POA: Diagnosis present

## 2023-10-10 DIAGNOSIS — M79604 Pain in right leg: Secondary | ICD-10-CM | POA: Diagnosis not present

## 2023-10-10 DIAGNOSIS — R519 Headache, unspecified: Secondary | ICD-10-CM | POA: Diagnosis not present

## 2023-10-10 DIAGNOSIS — Y92410 Unspecified street and highway as the place of occurrence of the external cause: Secondary | ICD-10-CM | POA: Insufficient documentation

## 2023-10-10 MED ORDER — ACETAMINOPHEN 500 MG PO TABS
1000.0000 mg | ORAL_TABLET | Freq: Once | ORAL | Status: AC
  Administered 2023-10-10: 1000 mg via ORAL
  Filled 2023-10-10: qty 2
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
## 2023-11-15 ENCOUNTER — Other Ambulatory Visit (HOSPITAL_COMMUNITY): Payer: Self-pay | Admitting: Internal Medicine

## 2023-11-15 DIAGNOSIS — N029 Recurrent and persistent hematuria with unspecified morphologic changes: Secondary | ICD-10-CM

## 2023-11-15 DIAGNOSIS — R109 Unspecified abdominal pain: Secondary | ICD-10-CM

## 2023-12-04 ENCOUNTER — Ambulatory Visit (HOSPITAL_COMMUNITY)
Admission: RE | Admit: 2023-12-04 | Discharge: 2023-12-04 | Disposition: A | Discharge status: Home / Self Care | Payer: No Typology Code available for payment source | Source: Ambulatory Visit | Attending: Internal Medicine | Admitting: Internal Medicine

## 2023-12-04 DIAGNOSIS — R109 Unspecified abdominal pain: Secondary | ICD-10-CM | POA: Insufficient documentation

## 2023-12-04 DIAGNOSIS — N029 Recurrent and persistent hematuria with unspecified morphologic changes: Secondary | ICD-10-CM | POA: Insufficient documentation
